# Patient Record
Sex: Female | Born: 1990 | Race: Black or African American | Hispanic: No | Marital: Single | Smoking: Never smoker
Health system: Southern US, Community
[De-identification: ages and names within clinical notes are randomized; demographics above are authoritative.]

## PROBLEM LIST (undated history)

## (undated) DIAGNOSIS — D649 Anemia, unspecified: Secondary | ICD-10-CM

---
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
## 2009-02-26 ENCOUNTER — Inpatient Hospital Stay (HOSPITAL_COMMUNITY): Admission: AD | Admit: 2009-02-26 | Discharge: 2009-02-26 | Payer: Self-pay | Admitting: Obstetrics & Gynecology

## 2009-04-20 ENCOUNTER — Encounter (INDEPENDENT_AMBULATORY_CARE_PROVIDER_SITE_OTHER): Payer: Self-pay | Admitting: *Deleted

## 2009-04-22 ENCOUNTER — Encounter: Payer: Self-pay | Admitting: Family Medicine

## 2009-04-22 ENCOUNTER — Ambulatory Visit: Payer: Self-pay | Admitting: Family Medicine

## 2009-04-22 LAB — CONVERTED CEMR LAB
Antibody Screen: NEGATIVE
Basophils Absolute: 0 10*3/uL (ref 0.0–0.1)
Eosinophils Relative: 1 % (ref 0–5)
Hemoglobin: 10.7 g/dL — ABNORMAL LOW (ref 12.0–15.0)
Hepatitis B Surface Ag: NEGATIVE
Lymphocytes Relative: 17 % (ref 12–46)
Lymphs Abs: 1.8 10*3/uL (ref 0.7–4.0)
MCV: 89.4 fL (ref 78.0–100.0)
Monocytes Absolute: 0.5 10*3/uL (ref 0.1–1.0)
Neutro Abs: 8.3 10*3/uL — ABNORMAL HIGH (ref 1.7–7.7)
Neutrophils Relative %: 78 % — ABNORMAL HIGH (ref 43–77)
Platelets: 229 10*3/uL (ref 150–400)
RDW: 13.6 % (ref 11.5–15.5)
Rh Type: POSITIVE
Rubella: 39.2 intl units/mL — ABNORMAL HIGH
Sickle Cell Screen: NEGATIVE
WBC: 10.7 10*3/uL — ABNORMAL HIGH (ref 4.0–10.5)

## 2009-04-29 ENCOUNTER — Encounter: Payer: Self-pay | Admitting: Family Medicine

## 2009-04-29 ENCOUNTER — Other Ambulatory Visit: Admission: RE | Admit: 2009-04-29 | Discharge: 2009-04-29 | Payer: Self-pay | Admitting: Family Medicine

## 2009-04-29 ENCOUNTER — Ambulatory Visit: Payer: Self-pay | Admitting: Family Medicine

## 2009-04-29 DIAGNOSIS — N898 Other specified noninflammatory disorders of vagina: Secondary | ICD-10-CM | POA: Insufficient documentation

## 2009-04-29 LAB — CONVERTED CEMR LAB: Chlamydia, DNA Probe: NEGATIVE

## 2009-04-30 ENCOUNTER — Encounter: Payer: Self-pay | Admitting: Family Medicine

## 2009-04-30 ENCOUNTER — Encounter: Payer: Self-pay | Admitting: *Deleted

## 2009-04-30 ENCOUNTER — Ambulatory Visit (HOSPITAL_COMMUNITY): Admission: RE | Admit: 2009-04-30 | Discharge: 2009-04-30 | Payer: Self-pay | Admitting: Family Medicine

## 2009-05-11 ENCOUNTER — Ambulatory Visit: Payer: Self-pay | Admitting: Family Medicine

## 2009-05-11 ENCOUNTER — Telehealth: Payer: Self-pay | Admitting: Family Medicine

## 2009-05-27 ENCOUNTER — Ambulatory Visit: Payer: Self-pay | Admitting: Family Medicine

## 2009-06-17 ENCOUNTER — Telehealth: Payer: Self-pay | Admitting: Family Medicine

## 2009-07-01 ENCOUNTER — Ambulatory Visit: Payer: Self-pay | Admitting: Family Medicine

## 2009-07-01 LAB — CONVERTED CEMR LAB
HCT: 30.7 % — ABNORMAL LOW (ref 36.0–46.0)
WBC: 12.7 10*3/uL — ABNORMAL HIGH (ref 4.0–10.5)

## 2009-07-21 ENCOUNTER — Ambulatory Visit: Payer: Self-pay | Admitting: Family Medicine

## 2009-08-02 ENCOUNTER — Telehealth: Payer: Self-pay | Admitting: Family Medicine

## 2009-08-02 ENCOUNTER — Telehealth: Payer: Self-pay | Admitting: *Deleted

## 2009-08-02 ENCOUNTER — Inpatient Hospital Stay (HOSPITAL_COMMUNITY): Admission: AD | Admit: 2009-08-02 | Discharge: 2009-08-02 | Payer: Self-pay | Admitting: Family Medicine

## 2009-08-05 ENCOUNTER — Ambulatory Visit: Payer: Self-pay | Admitting: Family Medicine

## 2009-08-20 ENCOUNTER — Ambulatory Visit: Payer: Self-pay | Admitting: Family Medicine

## 2009-08-21 ENCOUNTER — Encounter: Payer: Self-pay | Admitting: Family Medicine

## 2009-09-10 ENCOUNTER — Ambulatory Visit: Payer: Self-pay | Admitting: Family Medicine

## 2009-09-17 ENCOUNTER — Ambulatory Visit: Payer: Self-pay | Admitting: Family Medicine

## 2009-09-17 ENCOUNTER — Ambulatory Visit (HOSPITAL_COMMUNITY): Admission: RE | Admit: 2009-09-17 | Discharge: 2009-09-17 | Payer: Self-pay | Admitting: Obstetrics & Gynecology

## 2009-09-18 ENCOUNTER — Ambulatory Visit: Payer: Self-pay | Admitting: Obstetrics & Gynecology

## 2009-09-18 ENCOUNTER — Inpatient Hospital Stay (HOSPITAL_COMMUNITY): Admission: AD | Admit: 2009-09-18 | Discharge: 2009-09-21 | Payer: Self-pay | Admitting: Obstetrics & Gynecology

## 2009-09-19 ENCOUNTER — Encounter: Payer: Self-pay | Admitting: Obstetrics & Gynecology

## 2009-09-20 ENCOUNTER — Encounter: Payer: Self-pay | Admitting: Family Medicine

## 2009-11-05 ENCOUNTER — Ambulatory Visit: Payer: Self-pay | Admitting: Family Medicine

## 2010-03-06 IMAGING — US US FETAL BPP W/O NONSTRESS
1 series · 4 of 4 positions shown · non-contrast
Comparison: none

OBSTETRICAL ULTRASOUND:
 This ultrasound exam was performed in the [HOSPITAL] Ultrasound Department.  The OB US report was generated in the AS system, and faxed to the ordering physician.  This report is also available in [HOSPITAL]?s [employer] and in [REDACTED] PACS.

[Series 1: us fetal bpp w/o nonstress · non-contrast · 4 acquisitions, 4 frames shown]
[im 1/4]
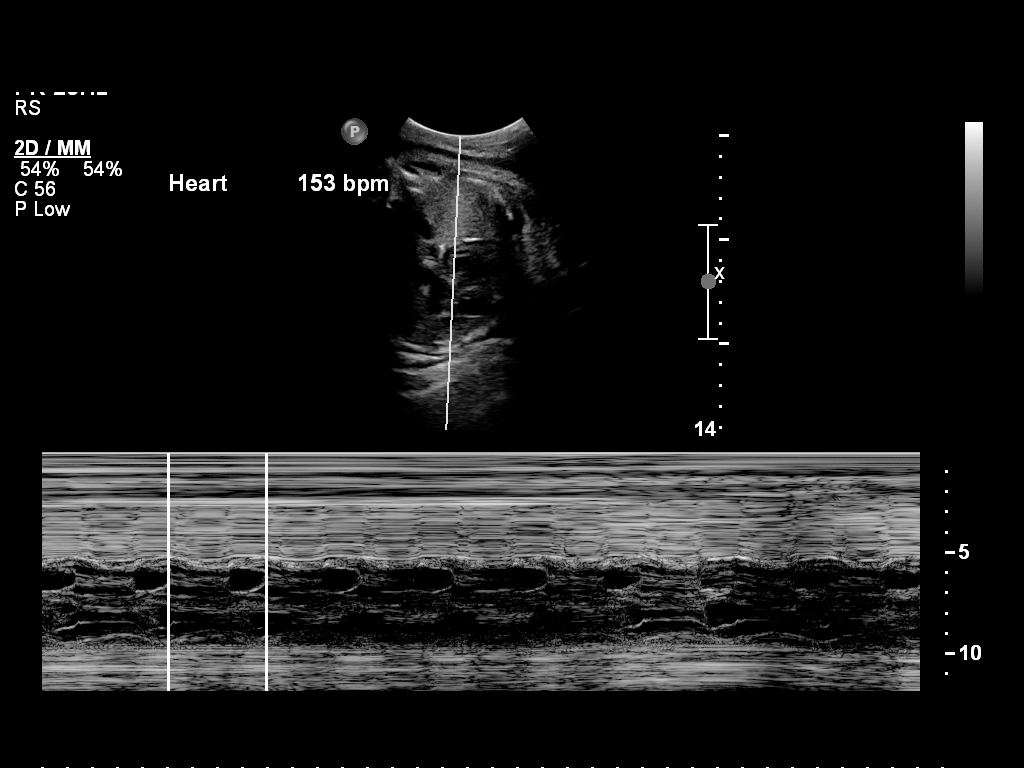
[im 2/4]
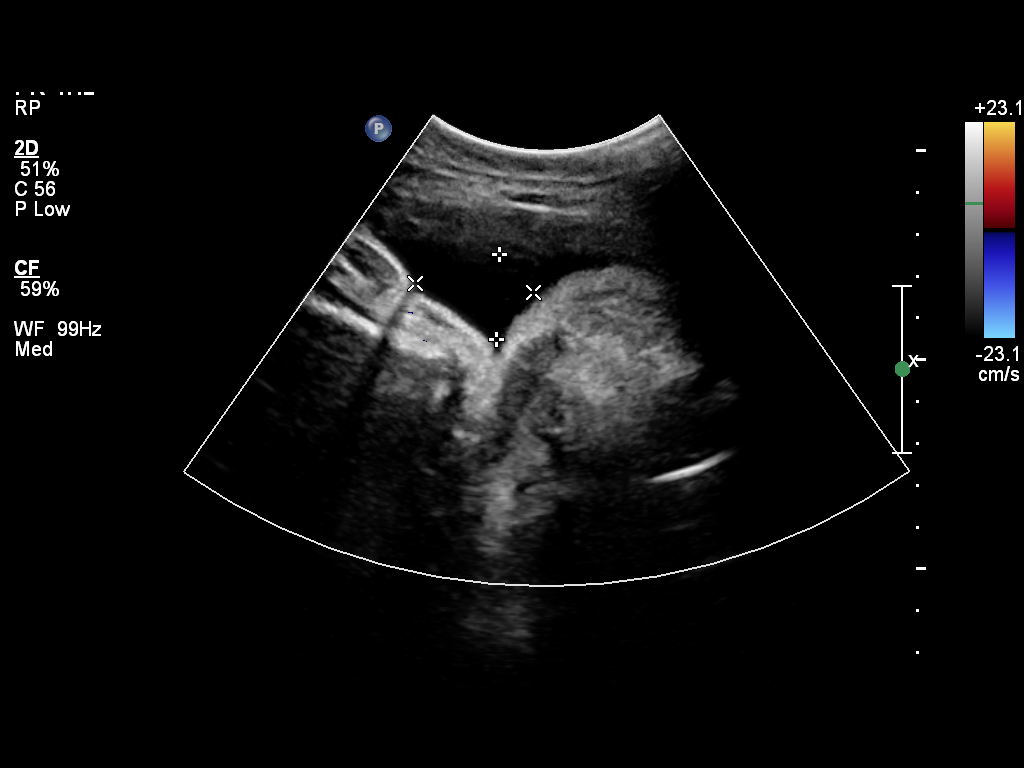
[im 3/4]
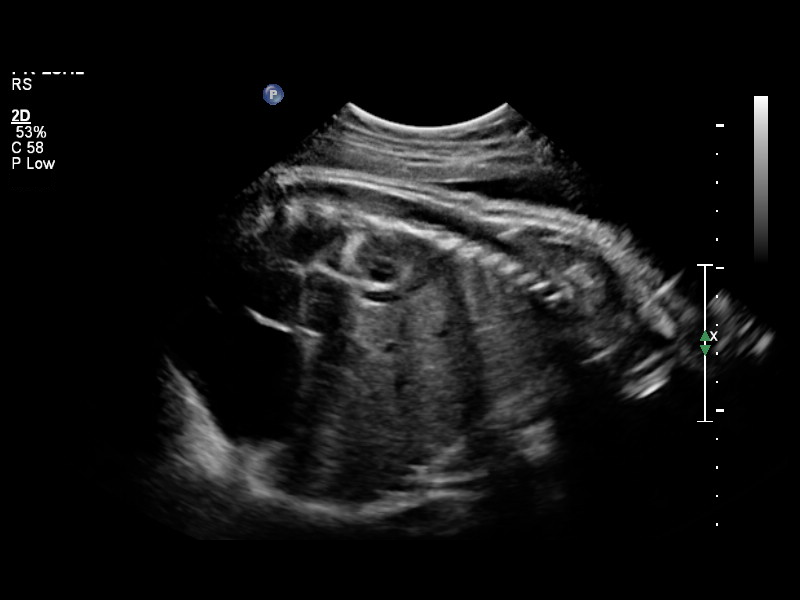
[im 4/4]
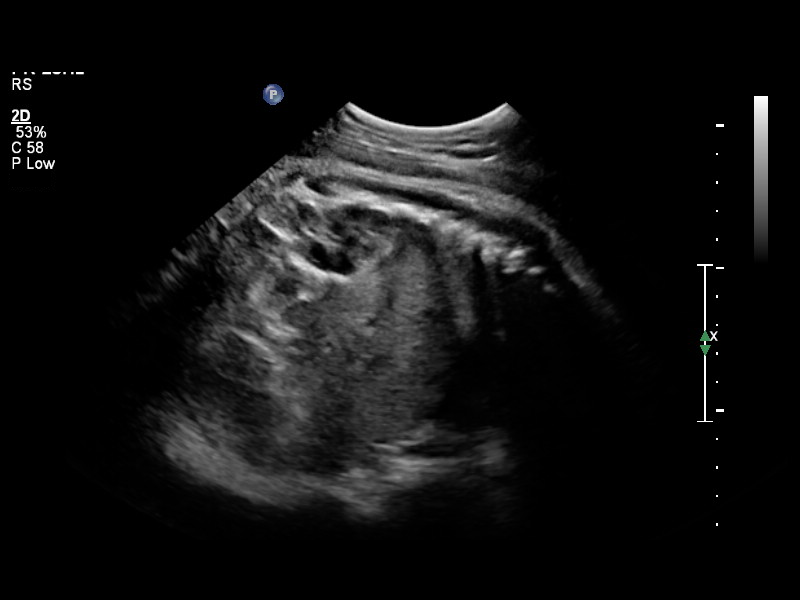

[4 of 4 positions shown; findings below may reference images not displayed]

IMPRESSION: See [facility] Obstetric US report.

## 2010-06-03 ENCOUNTER — Emergency Department (HOSPITAL_COMMUNITY): Admission: EM | Admit: 2010-06-03 | Discharge: 2010-06-03 | Payer: Self-pay | Admitting: Emergency Medicine

## 2010-06-15 ENCOUNTER — Ambulatory Visit: Payer: Self-pay | Admitting: Family Medicine

## 2010-06-16 ENCOUNTER — Encounter: Payer: Self-pay | Admitting: Family Medicine

## 2011-01-17 NOTE — Assessment & Plan Note (Signed)
Summary: sore throat,tcb   Vital Signs:  Patient profile:   20 year old female Weight:      126.4 pounds Temp:     98.1 degrees F oral Pulse rate:   86 / minute BP sitting:   94 / 61  (left arm) Cuff size:   regular  Vitals Entered By: Jimmy Footman, CMA (June 15, 2010 1:36 PM) CC: Sore throat x1 weeks Is Patient Diabetic? No Pain Assessment Patient in pain? no      Comments Seen in ER given prednisone and still not better   Primary Care Provider:  Delbert Harness MD  CC:  Sore throat x1 weeks.  History of Present Illness: Sore throat for almost 2 weeks now.  Went to ED on 6/17 and strep was negative.  Note reviewed in E-Chart.  Given prednisone and viscous lidocaine and advised to also take ibuprofen for "viral pharyngitis".  Has finished the prednisone and doesn't feel any better.  No fever, HA, nausea, vomitting, cough, runny nose, or congestion.  No heartburn.  Had "pharyngitis" 3 years ago and it feels like that.  Hurts primarily when she swallows or when she lays down at night.  Otherwise not causing much pain.    Habits & Providers  Alcohol-Tobacco-Diet     Tobacco Status: never  Physical Exam  General:  thin, alert, NAD, vitals reviewed.  Eyes:  conjunctiva clear and moist Nose:  mucosa moderately edematous, normal color, clear discharge Mouth:  oropharynx erythematous with some postnasal drip.  Tonsils minimally enlarged with some white exudate/debris Lungs:  Normal respiratory effort, chest expands symmetrically. Lungs are clear to auscultation, no crackles or wheezes. Heart:  Normal rate and regular rhythm. S1 and S2 normal without gallop, murmur, click, rub or other extra sounds. Cervical Nodes:  No lymphadenopathy noted   Impression & Recommendations:  Problem # 1:  SORE THROAT (ICD-462) Assessment New Rapid strep negative.  Will send for culture.  Irritatino could likely be caused by postnasal drip.  Will try flonase while waiting for result of throat culture.   Orders: Rapid Strep-[facility] (29518)  Complete Medication List: 1)  Calna Tabs (Prenatal vitamins) .... Take 1 tablet by mouth once daily 2)  Tri-sprintec 0.18/0.215/0.25 Mg-35 Mcg Tabs (Norgestim-eth estrad triphasic) .... Take one tablet a t the same time daily 3)  Flonase 50 Mcg/act Susp (Fluticasone propionate) .... 2 sprays each nostril daily  Other Orders: Grp A Strep-[facility] ([id number]) [facility]- Est Level  3 (60109)  Patient Instructions: 1)  Your strep test is negative again.  We will send it to be cultured though to make sure. 2)  However, with no fever or other symptoms and pain only with swallowing, it is unlikely to be a bacteria.  3)  If it does grow a bacteria we will call you and send out an antibiotic for you.  In the meantime, try the flonase nasal spray as I feel your symptoms may be coming from postnasal drip.  Prescriptions: FLONASE 50 MCG/ACT SUSP (FLUTICASONE PROPIONATE) 2 sprays each nostril daily  #1 x 3   Entered and Authorized by:   Ardeen Garland  MD   Signed by:   Ardeen Garland  MD on 06/15/2010   Method used:   Print then Give to Patient   RxID:   [id number]   Laboratory Results  Date/Time Received: June 15, 2010 2:05 PM  Date/Time Reported: June 15, 2010 2:27 PM   Other Tests  Rapid Strep: negative Comments: sent for culture ...............test performed  by......Marland KitchenBonnie A. [place], MLS (ASCP)cm

## 2011-02-02 ENCOUNTER — Encounter: Payer: Self-pay | Admitting: *Deleted

## 2011-03-23 LAB — CBC
HCT: 33.2 % — ABNORMAL LOW (ref 36.0–46.0)
Hemoglobin: 10.9 g/dL — ABNORMAL LOW (ref 12.0–15.0)
MCHC: 33.4 g/dL (ref 30.0–36.0)
MCV: 91.2 fL (ref 78.0–100.0)
Platelets: 168 10*3/uL (ref 150–400)
Platelets: 210 10*3/uL (ref 150–400)

## 2011-03-23 LAB — RPR: RPR Ser Ql: NONREACTIVE

## 2011-03-25 LAB — WET PREP, GENITAL
Clue Cells Wet Prep HPF POC: NONE SEEN
Trich, Wet Prep: NONE SEEN

## 2011-03-26 LAB — GLUCOSE, CAPILLARY: Glucose-Capillary: 109 mg/dL — ABNORMAL HIGH (ref 70–99)

## 2011-03-30 LAB — CBC
HCT: 33.4 % — ABNORMAL LOW (ref 36.0–46.0)
Hemoglobin: 11.2 g/dL — ABNORMAL LOW (ref 12.0–15.0)
MCHC: 33.6 g/dL (ref 30.0–36.0)
MCV: 88.8 fL (ref 78.0–100.0)
Platelets: 256 10*3/uL (ref 150–400)
RBC: 3.76 MIL/uL — ABNORMAL LOW (ref 3.87–5.11)
RDW: 13.1 % (ref 11.5–15.5)
WBC: 10.8 10*3/uL — ABNORMAL HIGH (ref 4.0–10.5)

## 2011-03-30 LAB — WET PREP, GENITAL
Trich, Wet Prep: NONE SEEN
Yeast Wet Prep HPF POC: NONE SEEN

## 2011-03-30 LAB — ABO/RH: ABO/RH(D): O POS

## 2011-03-30 LAB — HCG, QUANTITATIVE, PREGNANCY: hCG, Beta Chain, Quant, S: 136766 m[IU]/mL — ABNORMAL HIGH (ref <5)

## 2011-03-30 LAB — GC/CHLAMYDIA PROBE AMP, GENITAL: GC Probe Amp, Genital: NEGATIVE

## 2011-09-12 LAB — RAPID STREP SCREEN (MED CTR MEBANE ONLY): Streptococcus, Group A Screen (Direct): NEGATIVE

## 2014-01-23 ENCOUNTER — Emergency Department (HOSPITAL_COMMUNITY)
Admission: EM | Admit: 2014-01-23 | Discharge: 2014-01-23 | Disposition: A | Discharge status: Home / Self Care | Payer: No Typology Code available for payment source | Attending: Emergency Medicine | Admitting: Emergency Medicine

## 2014-01-23 ENCOUNTER — Encounter (HOSPITAL_COMMUNITY): Payer: Self-pay | Admitting: Emergency Medicine

## 2014-01-23 DIAGNOSIS — Z3202 Encounter for pregnancy test, result negative: Secondary | ICD-10-CM | POA: Insufficient documentation

## 2014-01-23 DIAGNOSIS — Z79899 Other long term (current) drug therapy: Secondary | ICD-10-CM | POA: Insufficient documentation

## 2014-01-23 DIAGNOSIS — N898 Other specified noninflammatory disorders of vagina: Secondary | ICD-10-CM | POA: Insufficient documentation

## 2014-01-23 DIAGNOSIS — Z8619 Personal history of other infectious and parasitic diseases: Secondary | ICD-10-CM | POA: Insufficient documentation

## 2014-01-23 LAB — WET PREP, GENITAL
Trich, Wet Prep: NONE SEEN
Yeast Wet Prep HPF POC: NONE SEEN

## 2014-01-23 LAB — URINALYSIS, ROUTINE W REFLEX MICROSCOPIC
BILIRUBIN URINE: NEGATIVE
Glucose, UA: NEGATIVE mg/dL
Ketones, ur: NEGATIVE mg/dL
Nitrite: NEGATIVE
Protein, ur: NEGATIVE mg/dL
Specific Gravity, Urine: <1.005 — ABNORMAL LOW (ref 1.005–1.030)
Urobilinogen, UA: 0.2 mg/dL (ref 0.0–1.0)
pH: 6.5 (ref 5.0–8.0)

## 2014-01-23 LAB — URINE MICROSCOPIC-ADD ON

## 2014-01-23 LAB — POCT PREGNANCY, URINE: Preg Test, Ur: NEGATIVE

## 2014-01-23 MED ORDER — AZITHROMYCIN 250 MG PO TABS
1000.0000 mg | ORAL_TABLET | Freq: Once | ORAL | Status: AC
  Administered 2014-01-23: 1000 mg via ORAL
  Filled 2014-01-23: qty 4

## 2014-01-23 MED ORDER — LIDOCAINE HCL (PF) 1 % IJ SOLN
INTRAMUSCULAR | Status: AC
  Administered 2014-01-23: 0.9 mL
  Filled 2014-01-23: qty 5

## 2014-01-23 MED ORDER — CEFTRIAXONE SODIUM 250 MG IJ SOLR
250.0000 mg | Freq: Once | INTRAMUSCULAR | Status: AC
  Administered 2014-01-23: 250 mg via INTRAMUSCULAR
  Filled 2014-01-23: qty 250

## 2014-01-23 NOTE — ED Notes (Signed)
Pt in c/o vaginal discharge and itching x1 week

## 2014-01-23 NOTE — ED Provider Notes (Signed)
Medical screening examination/treatment/procedure(s) were conducted as a shared visit with non-physician practitioner(s) or resident and myself. I personally evaluated the patient during the encounter and agree with the findings and plan unless otherwise indicated.  I have personally reviewed any xrays and/ or EKG's with the provider and I agree with interpretation.  Vaginal discharge for 1 wk. No abd pain. STD hx. No new sexual partners. GU exam benign per NP. STD prophylaxis, fup outpt discussed. Abdo soft/ NT, well appearing.   Vaginal discharge   Enid SkeensJoshua M Caryl Fate, MD 01/23/14 2007

## 2014-01-23 NOTE — ED Notes (Signed)
Vaginal itching with thick whitish discharge, stated vagina was swelling and she applied warm pack. Stated gonorrhea few months ago and was treated. Stated some pain with walking because of the swelling.

## 2014-01-23 NOTE — ED Provider Notes (Signed)
CSN: [id number]     Arrival date & time 01/23/14  1456 History   First MD Initiated Contact with Patient 01/23/14 [phone]     Chief Complaint  Patient presents with  . Vaginal Discharge   (Consider location/radiation/quality/duration/timing/severity/associated sxs/prior Treatment) HPI  23 year old female with prior history of gonorrhea and history of BV presents complaining of vaginal discomfort. Patient states for the past week she has noticed some mild swelling to the vaginal lips, with itchiness and for the past 2-3 days she also notice white vaginal discharge. States she has been applying warm compresses and taking Tylenol with minimal relief. Her symptoms is progressively worse prompting her to come to the ER for further evaluation. No complaints of fever, chills, abdominal pain, back pain, dysuria, hematuria, pain with sexual activity, or rash. Last sexual activity was 3 weeks ago. Last menstrual period was 2 weeks ago. Currently not pregnant.  History reviewed. No pertinent past medical history. No past surgical history on file. No family history on file. History  Substance Use Topics  . Smoking status: Not on file  . Smokeless tobacco: Not on file  . Alcohol Use: Not on file   OB History   Grav Para Term Preterm Abortions TAB SAB Ect Mult Living                 Review of Systems  Constitutional: Negative for fever.  Gastrointestinal: Negative for abdominal pain.  Genitourinary: Positive for vaginal discharge. Negative for dysuria and pelvic pain.  Skin: Negative for rash.  Neurological: Negative for numbness.    Allergies  Review of patient's allergies indicates no known allergies.  Home Medications   Current Outpatient Rx  Name  Route  Sig  Dispense  Refill  . acetaminophen (TYLENOL) 500 MG tablet   Oral   Take 500 mg by mouth every 6 (six) hours as needed for mild pain.         . Probiotic Product (PROBIOTIC & ACIDOPHILUS EX ST PO)   Oral   Take 1 capsule by  mouth daily.          BP 144/87  Pulse 83  Temp(Src) 98.5 F (36.9 C) (Oral)  Resp 20  SpO2 100%  LMP 01/04/2014 Physical Exam  Nursing note and vitals reviewed. Constitutional: She appears well-developed and well-nourished. No distress.  HENT:  Head: Normocephalic and atraumatic.  Eyes: Conjunctivae are normal.  Neck: Normal range of motion. Neck supple.  Cardiovascular: Normal rate and regular rhythm.   Pulmonary/Chest: Effort normal and breath sounds normal. She exhibits no tenderness.  Abdominal: Soft. There is no tenderness.  Genitourinary: Vagina normal and uterus normal. There is no rash or lesion on the right labia. There is no rash or lesion on the left labia. Cervix exhibits no motion tenderness and no discharge. Right adnexum displays no mass and no tenderness. Left adnexum displays no mass and no tenderness. No erythema, tenderness or bleeding around the vagina. No vaginal discharge found.  Chaperone present:  Lymphadenopathy:       Right: No inguinal adenopathy present.       Left: No inguinal adenopathy present.    ED Course  Procedures (including critical care time)  8:05 PM Vaginal discharge.  Wet prep unremarkable but given vaginal discharge, prior STD, pt received prophylactic treatment with rocephin/zithromax.  Cultures sent.    Labs Review Labs Reviewed  WET PREP, GENITAL - Abnormal; Notable for the following:    Clue Cells Wet Prep HPF POC FEW (*)  WBC, Wet Prep HPF POC MANY (*)    All other components within normal limits  URINALYSIS, ROUTINE W REFLEX MICROSCOPIC - Abnormal; Notable for the following:    APPearance HAZY (*)    Specific Gravity, Urine <1.005 (*)    Hgb urine dipstick TRACE (*)    Leukocytes, UA LARGE (*)    All other components within normal limits  URINE MICROSCOPIC-ADD ON - Abnormal; Notable for the following:    Squamous Epithelial / LPF MANY (*)    Bacteria, UA FEW (*)    All other components within normal limits   GC/CHLAMYDIA PROBE AMP  URINE CULTURE  POCT PREGNANCY, URINE   Imaging Review No results found.  EKG Interpretation   None       MDM   1. Vaginal discharge    BP 123/78  Pulse 83  Temp(Src) 98.1 F (36.7 C) (Oral)  Resp 18  SpO2 100%  LMP 01/04/2014     Fayrene Helper, PA-C 01/23/14 2006

## 2014-01-23 NOTE — Discharge Instructions (Signed)
Follow up vaginal cultures. If you were given medicines take as directed.  If you are on coumadin or contraceptives realize their levels and effectiveness is altered by many different medicines.  If you have any reaction (rash, tongues swelling, other) to the medicines stop taking and see a physician.   Please follow up as directed and return to the ER or see a physician for new or worsening symptoms.  Thank you.

## 2014-01-24 LAB — GC/CHLAMYDIA PROBE AMP
CT Probe RNA: NEGATIVE
GC PROBE AMP APTIMA: NEGATIVE

## 2014-01-24 LAB — URINE CULTURE
COLONY COUNT: NO GROWTH
CULTURE: NO GROWTH

## 2014-03-10 ENCOUNTER — Encounter (HOSPITAL_COMMUNITY): Payer: Self-pay | Admitting: Emergency Medicine

## 2014-03-10 ENCOUNTER — Emergency Department (HOSPITAL_COMMUNITY)
Admission: EM | Admit: 2014-03-10 | Discharge: 2014-03-10 | Disposition: A | Discharge status: Home / Self Care | Payer: No Typology Code available for payment source | Attending: Emergency Medicine | Admitting: Emergency Medicine

## 2014-03-10 DIAGNOSIS — J020 Streptococcal pharyngitis: Secondary | ICD-10-CM | POA: Insufficient documentation

## 2014-03-10 DIAGNOSIS — Z79899 Other long term (current) drug therapy: Secondary | ICD-10-CM | POA: Insufficient documentation

## 2014-03-10 LAB — RAPID STREP SCREEN (MED CTR MEBANE ONLY): Streptococcus, Group A Screen (Direct): POSITIVE — AB

## 2014-03-10 MED ORDER — PENICILLIN G BENZATHINE 1200000 UNIT/2ML IM SUSP
1.2000 10*6.[IU] | Freq: Once | INTRAMUSCULAR | Status: AC
  Administered 2014-03-10: 1.2 10*6.[IU] via INTRAMUSCULAR
  Filled 2014-03-10: qty 2

## 2014-03-10 MED ORDER — DEXAMETHASONE SODIUM PHOSPHATE 10 MG/ML IJ SOLN
10.0000 mg | Freq: Once | INTRAMUSCULAR | Status: AC
  Administered 2014-03-10: 10 mg via INTRAMUSCULAR
  Filled 2014-03-10: qty 1

## 2014-03-10 NOTE — Discharge Instructions (Signed)
Drink plenty of fluids and rest  Return to the emergency department if you develop any changing/worsening condition, fever not reducing, difficulty swallowing/breathing, neck swelling, or any other concerns (please read additional information regarding your condition below)    Strep Throat Strep throat is an infection of the throat caused by a bacteria named Streptococcus pyogenes. Your caregiver may call the infection streptococcal "tonsillitis" or "pharyngitis" depending on whether there are signs of inflammation in the tonsils or back of the throat. Strep throat is most common in children aged 5 15 years during the cold months of the year, but it can occur in people of any age during any season. This infection is spread from person to person (contagious) through coughing, sneezing, or other close contact. SYMPTOMS   Fever or chills.  Painful, swollen, red tonsils or throat.  Pain or difficulty when swallowing.  White or yellow spots on the tonsils or throat.  Swollen, tender lymph nodes or "glands" of the neck or under the jaw.  Red rash all over the body (rare). DIAGNOSIS  Many different infections can cause the same symptoms. A test must be done to confirm the diagnosis so the right treatment can be given. A "rapid strep test" can help your caregiver make the diagnosis in a few minutes. If this test is not available, a light swab of the infected area can be used for a throat culture test. If a throat culture test is done, results are usually available in a day or two. TREATMENT  Strep throat is treated with antibiotic medicine. HOME CARE INSTRUCTIONS   Gargle with 1 tsp of salt in 1 cup of warm water, 3 4 times per day or as needed for comfort.  Family members who also have a sore throat or fever should be tested for strep throat and treated with antibiotics if they have the strep infection.  Make sure everyone in your household washes their hands well.  Do not share food,  drinking cups, or personal items that could cause the infection to spread to others.  You may need to eat a soft food diet until your sore throat gets better.  Drink enough water and fluids to keep your urine clear or pale yellow. This will help prevent dehydration.  Get plenty of rest.  Stay home from school, daycare, or work until you have been on antibiotics for 24 hours.  Only take over-the-counter or prescription medicines for pain, discomfort, or fever as directed by your caregiver.  If antibiotics are prescribed, take them as directed. Finish them even if you start to feel better. SEEK MEDICAL CARE IF:   The glands in your neck continue to enlarge.  You develop a rash, cough, or earache.  You cough up green, yellow-brown, or bloody sputum.  You have pain or discomfort not controlled by medicines.  Your problems seem to be getting worse rather than better. SEEK IMMEDIATE MEDICAL CARE IF:   You develop any new symptoms such as vomiting, severe headache, stiff or painful neck, chest pain, shortness of breath, or trouble swallowing.  You develop severe throat pain, drooling, or changes in your voice.  You develop swelling of the neck, or the skin on the neck becomes red and tender.  You have a fever.  You develop signs of dehydration, such as fatigue, dry mouth, and decreased urination.  You become increasingly sleepy, or you cannot wake up completely. Document Released: 12/01/2000 Document Revised: 11/20/2012 Document Reviewed: 02/02/2011 [facility] Patient Information 2014 [employer], [place].  Emergency Department Resource Guide 1) Find a Doctor and Pay Out of Pocket Although you won't have to find out who is covered by your insurance plan, it is a good idea to ask around and get recommendations. You will then need to call the office and see if the doctor you have chosen will accept you as a new patient and what types of options they offer for patients who are self-pay.  Some doctors offer discounts or will set up payment plans for their patients who do not have insurance, but you will need to ask so you aren't surprised when you get to your appointment.  2) Contact Your Local Health Department Not all health departments have doctors that can see patients for sick visits, but many do, so it is worth a call to see if yours does. If you don't know where your local health department is, you can check in your phone book. The CDC also has a tool to help you locate your state's health department, and many state websites also have listings of all of their local health departments.  3) Find a Walk-in Clinic If your illness is not likely to be very severe or complicated, you may want to try a walk in clinic. These are popping up all over the country in pharmacies, drugstores, and shopping centers. They're usually staffed by nurse practitioners or physician assistants that have been trained to treat common illnesses and complaints. They're usually fairly quick and inexpensive. However, if you have serious medical issues or chronic medical problems, these are probably not your best option.  No Primary Care Doctor: - Call Health Connect at  [phone] - they can help you locate a primary care doctor that  accepts your insurance, provides certain services, etc. - Physician Referral Service- [phone]  Chronic Pain Problems: Organization         Address  Phone   Notes  [facility]  [phone] Patients need to be referred by their primary care doctor.   Medication Assistance: Organization         Address  Phone   Notes  [facility] Medication [facility] [address] [place], [place] [address] [phone] --Must be a resident of [facility] -- Must have NO insurance coverage whatsoever (no Medicaid/ Medicare, etc.) -- The pt. MUST have a primary care doctor that directs their care regularly and follows them in the  community   [facility]ist  [phone]   [employer]  [phone]    Agencies that provide inexpensive medical care: Organization         Address  Phone   Notes  [facility]  [phone]   [facility]    [phone]   [facility] [address] [place], [place] [address] [phone]   [facility] [address] [place], [place] [phone]   Planned Parenthood    [phone]   [facility]    [phone]   [facility]  [address], [place] Phone:  [phone], Fax:  [phone] Hours of Operation:  9 am - 6 pm, M-F.  Also accepts Medicaid/Medicare and self-pay.  [facility]  [address], [place] Phone: [phone], Fax: [phone]. Hours of Operation:  8:30 am - 5:30 pm, M-F.  Also accepts Medicaid and self-pay.  [facility] [place]  [address] [employer] Phone: [phone]   [facility] [address] Natasha Bence [place], [place] [phone], Ext. [phone] Mondays & Thursdays: 7-9 AM.  First 15 patients are seen on a first come, first serve basis.    Medicaid-accepting [facility] Providers:  Organization         Address  Phone   Notes  [facility] [address], [place] [phone] Also accepts self-pay patients.  [facility] [address] Laurell Josephs [place], [place]  [phone]   [facility] [address], [place] [phone]   [facility] [address] [place] [phone]   Renaye Rakers [address], [place]   [phone] Only accepts [place] patients after they have their name applied to their card.   Self-Pay (no insurance) in [facility]:  Organization         Address  Phone   Notes  Sickle Cell Patients, [facility]  Internal Medicine [address] [place], [place] [phone]   [facility] [address] [place], [place] [phone]   [facility] [place]  [address], [place] [phone]   [facility]/Dr. Osei-Bonsu  [address] [place] or [address], [place] [phone] Phone number for both [place] and [place] locations is the same.  [facility] [address] [place] [phone]   [facility] [address] [place] or [address] [phone]   [facility] [address] [place] [phone], phone; [phone], fax Sees patients 1st and 3rd Saturday of every month.  Must not qualify for public or private insurance (i.e. Medicaid, Medicare, [place] Health Choice, Veterans' Benefits)  Household income should be no more than 200% of the poverty level The clinic cannot treat you if you are pregnant or think you are pregnant  Sexually transmitted diseases are not treated at the clinic.    Dental Care: Organization         Address  Phone  Notes  [facility] Department of [facility] [address] [place], [place] [phone] Accepts children up to age 35 who are enrolled in [place] or [place] Health Choice; pregnant women with a Medicaid card; and children who have applied for Medicaid or [place] Health Choice, but were declined, whose parents can pay a reduced fee at time of service.  [facility] Department of [facility]  [address], [place] [phone] Accepts children up to age 6 who are enrolled in [place] or [place] Health Choice; pregnant women with a Medicaid card; and children who have applied for Medicaid or [place] Health Choice, but were declined, whose parents can pay a reduced fee at time of service.  [facility]  [address] [place], [place] [phone] Patients are seen by appointment only. Walk-ins are not accepted. [facility] will see patients 66 years of age and older. Monday - Tuesday (8am-5pm) Most Wednesdays (8:30-5pm) $30 per visit, cash only  [facility]  [address], [facility] [phone] Patients are seen by appointment only. Walk-ins are not accepted. [facility] will see patients 47 years of age and older.  One Wednesday Evening (Monthly: Volunteer Based).  $30 per visit, cash only  [employer] [facility]  [phone] for adults; Children under age 804, call Graduate Pediatric Dentistry at [phone]. Children aged 394-14, please call [phone] to request a pediatric application.  Dental services are provided in all areas of dental care including fillings, crowns and bridges, complete and partial dentures, implants, gum treatment, root canals, and extractions. Preventive care is also provided. Treatment is provided to both adults and children. Patients are selected via a lottery and there is often a waiting list.   [facility] [address], [place]  [phone] [email or web address]   [facility] [address], [place], [place] [phone], Ext. [phone] Second and Fourth Thursday of each month, opens at 6:30 AM; Clinic ends at 9 AM.  Patients are seen on a first-come first-served basis, and a limited number are seen during each clinic.   [facility]  [address], [place], [place] [phone]) [phone]   Eligibility Requirements You must have lived in [place], [place], or [place] counties for at least the last three months.   You cannot be eligible for state or federal sponsored [place] Cityhealthcare insurance, including CIGNAVeterans Administration, [place]icaid, or [employer]Medicare.   You generally cannot be eligible for healthcare insurance through your employer.    How to apply: Eligibility screenings are held every  Tuesday and Wednesday afternoon from 1:00 pm until 4:00 pm. You do not need an appointment for the interview!  [facility] [address], [place], [place] [phone]   [facility]  [phone]   [facility]  [phone]   [facility]  [phone]    Behavioral Health Resources in the Community: Intensive Outpatient Programs Organization         Address  Phone  Notes  [facility] [address], [place], [place] [phone]   [facility] [address], [place], [place] [phone]   [facility] [address], [place], [place]  [phone]   [facility] [address],  [place], [place] [phone] or [phone]   Substance Abuse Resources Organization         Address  Phone  Notes  [facility]  [phone]   [facility]  [phone]   [facility]  [phone]   [facility]  [phone]   Residential & Outpatient Substance Abuse Program  [phone]   Psychological Services Organization         Address  Phone  Notes  [facility]  [phone]- [phone]   [facility]  [phone]- [phone]   [facility] [address], [place] [phone] or [phone]    Mobile Crisis Teams Organization         Address  Phone  Notes  [facility], Mobile Crisis Care Unit  [phone]   [facility]  [address] [place], [place] [phone]   Doristine LocksSharon DeEsch [address] [place] [phone]    Self-Help/Support Groups Organization         Address  Phone             Notes  [facility] [place] - variety of support groups  [phone] Call for more information  Narcotics Anonymous (NA), [facility] [address], [place]  2 meetings at this location  Residential Treatment Programs Organization         Address  Phone  Notes  [facility] [address],    [place]  [phone]   [facility]  [address], [place] [phone], [place], [place] [phone]   [facility] [address], [place] [phone] Admissions: 8am-3pm M-F  [facility] [address]    [place], [place] [phone]   [facility] [address], [place], [place] [phone]   [facility] [address]  [place], [place] [phone]   [facility] - Intensive Outpatient [address], [place], [place] [phone]   [facility] ([facility] [address]  [place], [place] [phone] or [phone]   [facility] ([facility]) [address] [place], [place] [phone] Accepts Medicaid  [facility] [address]  [place] [phone] Substance Abuse/Addiction Treatment   [facility] Resources Organization         Address  Phone  Notes  [facility]  [phone]   Angie FavaJulie Brannon, PhD [address], [address] [place], [place]   [phone] or [phone]   [facility]   [address] [place], [place] [phone]   [facility] [address], [place], [place] [phone] Insurance/Medicaid/sponsorship through [facility]  [facility] Families [address]                                    [place], [place] [phone] Therapy/tele-psych/case  [facility] [address].   [place], [place] [phone](336) [phone]    Dr. Lolly MustacheArfeen  [phone]   [facility]  [employer] [facility] 1) [address], [place] 2) [address], [place] 3)  [address], [place] [phone]) [phone]  [phone]   [facility] Child Abuse Hotline [phone]) [phone] or [phone]) [phone] (After  Hours)

## 2014-03-10 NOTE — ED Provider Notes (Signed)
CSN: [id number]     Arrival date & time 03/10/14  1449 History   None   This chart was scribed for Junius Argyle PA-C, a non-physician practitioner working with Enid Skeens, MD by Lewanda Rife, ED Scribe. This patient was seen in room TR06C/TR06C and the patient's care was started at 3:38 PM      Chief Complaint  Patient presents with  . Sore Throat    The history is provided by the patient. No language interpreter was used.   HPI Comments: Karen Randall is a 23 y.o. female with no PMH who presents to the Emergency Department complaining of constant moderate sore throat onset yesterday. Reports associated headaches, subjective fever, myalgias and chills. Reports pain is exacerbated by swallowing. Denies trying any alleviating factors. Denies associated dysphagia, and cough. Denies sick contacts.    History reviewed. No pertinent past medical history. History reviewed. No pertinent past surgical history. History reviewed. No pertinent family history. History  Substance Use Topics  . Smoking status: Not on file  . Smokeless tobacco: Not on file  . Alcohol Use: Yes     Comment: socially   OB History   Grav Para Term Preterm Abortions TAB SAB Ect Mult Living                 Review of Systems  Constitutional: Positive for fever (subjective) and chills. Negative for diaphoresis, activity change and appetite change.  HENT: Positive for sore throat. Negative for congestion, rhinorrhea, trouble swallowing and voice change.   Respiratory: Negative for cough and shortness of breath.   Cardiovascular: Negative for chest pain.  Gastrointestinal: Negative for nausea, vomiting, abdominal pain and diarrhea.  Musculoskeletal: Positive for myalgias. Negative for neck pain and neck stiffness.  Skin: Negative for rash.  Neurological: Positive for headaches. Negative for dizziness, weakness and numbness.  Psychiatric/Behavioral: Negative for confusion.  All other systems reviewed  and are negative.    A complete 10 system review of systems was obtained and all systems are negative except as noted in the HPI and PMHx.     Allergies  Review of patient's allergies indicates no known allergies.  Home Medications   Current Outpatient Rx  Name  Route  Sig  Dispense  Refill  . acetaminophen (TYLENOL) 500 MG tablet   Oral   Take 500 mg by mouth every 6 (six) hours as needed for mild pain.         . Probiotic Product (PROBIOTIC & ACIDOPHILUS EX ST PO)   Oral   Take 1 capsule by mouth daily.          BP 121/67  Pulse 86  Temp(Src) 99 F (37.2 C) (Oral)  Resp 18  SpO2 97%  LMP 03/04/2014  Filed Vitals:   03/10/14 1456 03/10/14 1621  BP: 121/67 102/60  Pulse: 86 90  Temp: 99 F (37.2 C) 99 F (37.2 C)  TempSrc: Oral Oral  Resp: 18 17  SpO2: 97% 100%    Physical Exam  Nursing note and vitals reviewed. Constitutional: She is oriented to person, place, and time. She appears well-developed and well-nourished. No distress.  HENT:  Head: Normocephalic and atraumatic.  Right Ear: External ear normal.  Left Ear: External ear normal.  Nose: Nose normal.  Mouth/Throat: Oropharyngeal exudate present.  Tonsils with edema, erythema and exudates. Tonsils 2+ equal bilaterally. Uvula midline. No trismus. No difficulty controlling secretions. Tympanic membranes gray and translucent bilaterally with no erythema, edema, or hemotympanum.  No mastoid  or tragal tenderness bilaterally.   Eyes: Conjunctivae and EOM are normal. Pupils are equal, round, and reactive to light. Right eye exhibits no discharge. Left eye exhibits no discharge.  Neck: Normal range of motion. Neck supple. No tracheal deviation present.  Cardiovascular: Normal rate, regular rhythm and normal heart sounds.  Exam reveals no gallop and no friction rub.   No murmur heard. Pulmonary/Chest: Effort normal and breath sounds normal. No respiratory distress. She has no wheezes. She has no rales. She  exhibits no tenderness.  Abdominal: Soft. She exhibits no distension. There is no tenderness.  Musculoskeletal: Normal range of motion. She exhibits no edema and no tenderness.  Lymphadenopathy:    She has no cervical adenopathy.  Neurological: She is alert and oriented to person, place, and time.  Skin: Skin is warm and dry. No rash noted. She is not diaphoretic.  Psychiatric: She has a normal mood and affect. Her behavior is normal.    ED Course  Procedures  COORDINATION OF CARE:  Nursing notes reviewed. Vital signs reviewed. Initial pt interview and examination performed.   3:00 PM-Discussed work up plan with pt at bedside, which includes rapid strep screen. Pt agrees with plan.  3:38 PM Nursing Notes Reviewed/ Care Coordinated Interpretation of Laboratory Data incorporated into ED treatment Discussed results and treatment plan with pt. Pt demonstrates understanding and agrees with plan.   Treatment plan initiated:Medications - No data to display   Initial diagnostic testing ordered.     Labs Review Labs Reviewed  RAPID STREP SCREEN - Abnormal; Notable for the following:    Streptococcus, Group A Screen (Direct) POSITIVE (*)    All other components within normal limits   Imaging Review No results found.   EKG Interpretation None      MDM   Karen Randall is a 23 y.o. female with no PMH who presents to the Emergency Department complaining of constant moderate sore throat onset yesterday. Etiology of sore throat likely due to strep pharyngitis. Rapid strep positive. No evidence of peritonsillar or retropharyngeal abscess. No difficulty swallowing or controlling secretions. Patient afebrile and non-toxic in appearance. Patient given Penicillin IM and Decadron in the ED. Instructed to follow-up with PCP if symptoms not improving or worsening. Return precautions, discharge instructions, and follow-up was discussed with the patient before discharge.     Discharge  Medication List as of 03/10/2014  3:46 PM       Final impressions: 1. Strep pharyngitis       Karen SalesJessica Katlin Ennifer Harston PA-C   I personally performed the services described in this documentation, which was scribed in my presence. The recorded information has been reviewed and is accurate.       Karen LedgerJessica K Tavone Caesar, PA-C 03/11/14 1151

## 2014-03-10 NOTE — ED Notes (Signed)
Pt reports sore throat since yesterday morning and reports headache and chills last night. Airway intact.

## 2014-03-12 NOTE — ED Provider Notes (Signed)
Medical screening examination/treatment/procedure(s) were conducted as a shared visit with non-physician practitioner(s) or resident and myself. I personally evaluated the patient during the encounter and agree with the findings and plan unless otherwise indicated.  I have personally reviewed any xrays and/ or EKG's with the provider and I agree with interpretation.  Healthy female with sore throat since yesterday morning worse with swallowing mild gradual onset headaches and intermittent chills. No sick contacts or recent travel. Exam well-appearing female pharynx moist mucous membranes mild posterior erythema, no signs of PTA, supple neck, mild anterior cervical lymphadenopathy. No submandibular swelling or voice change. No signs of life-threatening head and neck infection. Plan for strep test and close followup outpatient.  Pharyngitis   Enid SkeensJoshua M Zaniah Titterington, MD 03/12/14 [phone]

## 2016-01-05 ENCOUNTER — Encounter (HOSPITAL_COMMUNITY): Payer: Self-pay | Admitting: *Deleted

## 2016-01-05 ENCOUNTER — Emergency Department (HOSPITAL_COMMUNITY)
Admission: EM | Admit: 2016-01-05 | Discharge: 2016-01-05 | Disposition: A | Discharge status: Home / Self Care | Payer: No Typology Code available for payment source | Attending: Emergency Medicine | Admitting: Emergency Medicine

## 2016-01-05 DIAGNOSIS — Z79899 Other long term (current) drug therapy: Secondary | ICD-10-CM | POA: Insufficient documentation

## 2016-01-05 DIAGNOSIS — J039 Acute tonsillitis, unspecified: Secondary | ICD-10-CM | POA: Insufficient documentation

## 2016-01-05 DIAGNOSIS — Z91040 Latex allergy status: Secondary | ICD-10-CM | POA: Insufficient documentation

## 2016-01-05 DIAGNOSIS — J029 Acute pharyngitis, unspecified: Secondary | ICD-10-CM

## 2016-01-05 LAB — RAPID STREP SCREEN (MED CTR MEBANE ONLY): STREPTOCOCCUS, GROUP A SCREEN (DIRECT): NEGATIVE

## 2016-01-05 NOTE — Discharge Instructions (Signed)
Continue to stay well-hydrated. Gargle warm salt water and spit it out. Use over the counter throat lozenges or chloraseptic spray as needed for additional pain relief. Continue to alternate between Tylenol and Ibuprofen for pain or fever. Use Mucinex for cough suppression/expectoration of mucus. Use netipot and flonase to help with nasal congestion. May consider over-the-counter Benadryl or other antihistamine to decrease secretions and for watery itchy eyes. Your strep test today was negative, but it will be sent for culture. The lab will call you if your strep test culture is positive, and will call you in an antibiotic at that time. It's possible that your sore throat is from a viral infection. Followup with [facility] in 5-7 days for recheck of ongoing symptoms and to establish medical care. Return to emergency department for emergent changing or worsening of symptoms.   Pharyngitis Pharyngitis is a sore throat (pharynx). There is redness, pain, and swelling of your throat. HOME CARE   Drink enough fluids to keep your pee (urine) clear or pale yellow.  Only take medicine as told by your doctor.  You may get sick again if you do not take medicine as told. Finish your medicines, even if you start to feel better.  Do not take aspirin.  Rest.  Rinse your mouth (gargle) with salt water ( tsp of salt per 1 qt of water) every 1-2 hours. This will help the pain.  If you are not at risk for choking, you can suck on hard candy or sore throat lozenges. GET HELP IF:  You have large, tender lumps on your neck.  You have a rash.  You cough up green, yellow-brown, or bloody spit. GET HELP RIGHT AWAY IF:   You have a stiff neck.  You drool or cannot swallow liquids.  You throw up (vomit) or are not able to keep medicine or liquids down.  You have very bad pain that does not go away with medicine.  You have problems breathing (not from a stuffy nose). MAKE SURE YOU:    Understand these instructions.  Will watch your condition.  Will get help right away if you are not doing well or get worse.   This information is not intended to replace advice given to you by your health care provider. Make sure you discuss any questions you have with your health care provider.   Document Released: 05/22/2008 Document Revised: 09/24/2013 Document Reviewed: 08/11/2013 Elsevier Interactive Patient Education 2016 [employer]  Tonsillitis Tonsillitis is an infection of the throat. This infection causes the tonsils to become red, tender, and puffy (swollen). Tonsils are groups of tissue at the back of your throat. If bacteria caused your infection, antibiotic medicine will be given to you. Sometimes symptoms of tonsillitis can be relieved with the use of steroid medicine. If your tonsillitis is severe and happens often, you may need to get your tonsils removed (tonsillectomy). HOME CARE   Rest and sleep often.  Drink enough fluids to keep your pee (urine) clear or pale yellow.  While your throat is sore, eat soft or liquid foods like:  Soup.  Ice cream.  Instant breakfast drinks.  Eat frozen ice pops.  Gargle with a warm or cold liquid to help soothe the throat. Gargle with a water and salt mix. Mix 1/4 teaspoon of salt and 1/4 teaspoon of baking soda in 1 cup of water.  Only take medicines as told by your doctor.  If you are given medicines (antibiotics), take them as  told. Doreatha Martin them even if you start to feel better. GET HELP IF:  You have large, tender lumps in your neck.  You have a rash.  You cough up green, yellow-brown, or bloody fluid.  You cannot swallow liquids or food for 24 hours.  You notice that only one of your tonsils is swollen. GET HELP RIGHT AWAY IF:   You throw up (vomit).  You have a very bad headache.  You have a stiff neck.  You have chest pain.  You have trouble breathing or swallowing.  You have bad throat pain,  drooling, or your voice changes.  You have bad pain not helped by medicine.  You cannot fully open your mouth.  You have redness, puffiness, or bad pain in the neck.  You have a fever. MAKE SURE YOU:   Understand these instructions.  Will watch your condition.  Will get help right away if you are not doing well or get worse.   This information is not intended to replace advice given to you by your health care provider. Make sure you discuss any questions you have with your health care provider.   Document Released: 05/22/2008 Document Revised: 12/09/2013 Document Reviewed: 05/23/2013 Elsevier Interactive Patient Education 2016 Elsevier Inc.  Sore Throat A sore throat is a painful, burning, sore, or scratchy feeling of the throat. There may be pain or tenderness when swallowing or talking. You may have other symptoms with a sore throat. These include coughing, sneezing, fever, or a swollen neck. A sore throat is often the first sign of another sickness. These sicknesses may include a cold, flu, strep throat, or an infection called mono. Most sore throats go away without medical treatment.  HOME CARE   Only take medicine as told by your doctor.  Drink enough fluids to keep your pee (urine) clear or pale yellow.  Rest as needed.  Try using throat sprays, lozenges, or suck on hard candy (if older than 4 years or as told).  Sip warm liquids, such as broth, herbal tea, or warm water with honey. Try sucking on frozen ice pops or drinking cold liquids.  Rinse the mouth (gargle) with salt water. Mix 1 teaspoon salt with 8 ounces of water.  Do not smoke. Avoid being around others when they are smoking.  Put a humidifier in your bedroom at night to moisten the air. You can also turn on a hot shower and sit in the bathroom for 5-10 minutes. Be sure the bathroom door is closed. GET HELP RIGHT AWAY IF:   You have trouble breathing.  You cannot swallow fluids, soft foods, or your spit  (saliva).  You have more puffiness (swelling) in the throat.  Your sore throat does not get better in 7 days.  You feel sick to your stomach (nauseous) and throw up (vomit).  You have a fever or lasting symptoms for more than 2-3 days.  You have a fever and your symptoms suddenly get worse. MAKE SURE YOU:   Understand these instructions.  Will watch your condition.  Will get help right away if you are not doing well or get worse.   This information is not intended to replace advice given to you by your health care provider. Make sure you discuss any questions you have with your health care provider.   Document Released: 09/12/2008 Document Revised: 08/28/2012 Document Reviewed: 08/11/2012 Elsevier Interactive Patient Education [employer]

## 2016-01-05 NOTE — ED Notes (Signed)
Pt reports sore throat x 2 days, denies fever. Airway intact.

## 2016-01-05 NOTE — ED Provider Notes (Signed)
CSN: [id number]     Arrival date & time 01/05/16  1751 History  By signing my name below, I, Evon Slack, attest that this documentation has been prepared under the direction and in the presence of Cayton Cuevas Camprubi-Soms, PA-C. Electronically Signed: Evon Slack, ED Scribe. 01/05/2016. 6:31 PM.     Chief Complaint  Patient presents with  . Sore Throat    Patient is a 25 y.o. female presenting with pharyngitis. The history is provided by the patient.  Sore Throat This is a new problem. The current episode started 2 days ago. The problem occurs constantly. The problem has not changed since onset.Pertinent negatives include no chest pain, no abdominal pain and no shortness of breath. The symptoms are aggravated by swallowing. Nothing relieves the symptoms. She has tried nothing for the symptoms. The treatment provided no relief.   HPI Comments: Karen Randall is a 25 y.o. female who presents to the Emergency Department complaining of 5/10 nonradiating scratchy/sore posterior throat pain onset 2 days prior. She states that the pain is worse when swallowing. Pt denies trying any medications PTA. Pt states she has a Hx of strep throat and that her pain feels similar to previous strep throat. Pt denies any recent sick contacts or traveling. Denies trouble swallowing, drooling, trismus, rhinorrhea, ear pain, ear drainage, CP, SOB, fever, chills, cough, abdominal pain, n/v/d/c, dysuria, hematuria, numbness, tingling, or weakness. Denies rashes or myalgias/arthralgias.     History reviewed. No pertinent past medical history. History reviewed. No pertinent past surgical history. History reviewed. No pertinent family history. Social History  Substance Use Topics  . Smoking status: Never Smoker   . Smokeless tobacco: None  . Alcohol Use: Yes     Comment: socially   OB History    No data available     Review of Systems  Constitutional: Negative for fever and chills.  HENT: Positive for  sore throat. Negative for drooling, ear discharge, ear pain, rhinorrhea and trouble swallowing.   Respiratory: Negative for cough and shortness of breath.   Cardiovascular: Negative for chest pain.  Gastrointestinal: Negative for nausea, vomiting, abdominal pain, diarrhea and constipation.  Genitourinary: Negative for dysuria and hematuria.  Musculoskeletal: Negative for myalgias and arthralgias.  Skin: Negative for color change.  Allergic/Immunologic: Negative for immunocompromised state.  Neurological: Negative for weakness and numbness.  Psychiatric/Behavioral: Negative for confusion.   10 Systems reviewed and all are negative for acute change except as noted in the HPI.    Allergies  Latex  Home Medications   Prior to Admission medications   Medication Sig Start Date End Date Taking? Authorizing Provider  acetaminophen (TYLENOL) 500 MG tablet Take 500 mg by mouth every 6 (six) hours as needed for mild pain.    Historical Provider, MD  Probiotic Product (PROBIOTIC & ACIDOPHILUS EX ST PO) Take 1 capsule by mouth daily.    Historical Provider, MD   BP 124/70 mmHg  Pulse 86  Temp(Src) 97.5 F (36.4 C) (Oral)  Resp 18  Ht  (1.549 m)  Wt 145 lb (65.772 kg)  BMI 27.41 kg/m2  SpO2 98%  LMP 12/29/2015   Physical Exam  Constitutional: She is oriented to person, place, and time. Vital signs are normal. She appears well-developed and well-nourished.  Non-toxic appearance. No distress.  Afebrile, nontoxic, NAD  HENT:  Head: Normocephalic and atraumatic.  Nose: Nose normal.  Mouth/Throat: Uvula is midline and mucous membranes are normal. No trismus in the jaw. No uvula swelling. Oropharyngeal exudate, posterior  oropharyngeal edema and posterior oropharyngeal erythema present. No tonsillar abscesses.  Nose clear. Oropharynx without uvular swelling or deviation, no trismus or drooling, with 2+ bilateral tonsillar swelling and erythema, +exudates, no evidence of PTA.   Eyes:  Conjunctivae and EOM are normal. Right eye exhibits no discharge. Left eye exhibits no discharge.  Neck: Normal range of motion. Neck supple.  Cardiovascular: Normal rate.   Pulmonary/Chest: Effort normal. No respiratory distress.  Abdominal: Normal appearance. She exhibits no distension.  Musculoskeletal: Normal range of motion.  Lymphadenopathy:       Head (right side): Tonsillar adenopathy present.       Head (left side): Tonsillar adenopathy present.    She has cervical adenopathy.  bilateral tonsillar and cervical LAD which is midly TTP  Neurological: She is alert and oriented to person, place, and time. She has normal strength. No sensory deficit.  Skin: Skin is warm, dry and intact. No rash noted.  Psychiatric: She has a normal mood and affect. Her behavior is normal.  Nursing note and vitals reviewed.   ED Course  Procedures (including critical care time) DIAGNOSTIC STUDIES: Oxygen Saturation is 98% on RA, normal by my interpretation.    COORDINATION OF CARE: 6:33 PM-Discussed treatment plan with pt at bedside and pt agreed to plan.      Labs Review Labs Reviewed  RAPID STREP SCREEN (NOT AT [facility])  CULTURE, GROUP A STREP [facility])    Imaging Review No results found.    EKG Interpretation None      MDM   Final diagnoses:  Pharyngitis  Tonsillitis with exudate  Sore throat     25 y.o. female here with sore throat x2 days with 2+tonsillar swelling, erythema, and exudates. Some LAD. Afebrile and handling secretions well, no evidence of ludwig's or PTA. CENTOR criteria moderate, will obtain RST. Pt declines wanting anything for pain. Will reassess shortly.   7:42 PM RST neg. Discussed that lab will call her if it cultures out positive, but that this could be a viral pharyngitis. Doubt need for empiric abx at this time. Pt is agreeable to symptomatic treatment with close follow up with [facility] to establish care in 1wk, and as needed but spoke at length about emergent  changing or worsening of symptoms that should prompt return to ER. Pt voices understanding and is agreeable to plan. Stable at time of discharge.   I personally performed the services described in this documentation, which was scribed in my presence. The recorded information has been reviewed and is accurate.    BP 124/70 mmHg  Pulse 86  Temp(Src) 97.5 F (36.4 C) (Oral)  Resp 18  Ht  (1.549 m)  Wt 65.772 kg  BMI 27.41 kg/m2  SpO2 98%  LMP 12/29/2015  No orders of the defined types were placed in this encounter.     France Ravens Camprubi-Soms, PA-C 01/05/16 1943  Doug Sou, MD 01/05/16 2342

## 2016-01-05 NOTE — ED Notes (Signed)
Pt c/o sore throat x's 2-3 days with white spots on her tonsils  Pt denies any fevers

## 2016-01-08 LAB — CULTURE, GROUP A STREP (THRC)

## 2017-03-01 ENCOUNTER — Encounter (HOSPITAL_COMMUNITY): Payer: Self-pay | Admitting: Neurology

## 2017-03-01 ENCOUNTER — Emergency Department (HOSPITAL_COMMUNITY)
Admission: EM | Admit: 2017-03-01 | Discharge: 2017-03-01 | Disposition: A | Discharge status: Home / Self Care | Payer: No Typology Code available for payment source | Attending: Emergency Medicine | Admitting: Emergency Medicine

## 2017-03-01 DIAGNOSIS — Z91040 Latex allergy status: Secondary | ICD-10-CM | POA: Insufficient documentation

## 2017-03-01 DIAGNOSIS — B3731 Acute candidiasis of vulva and vagina: Secondary | ICD-10-CM

## 2017-03-01 DIAGNOSIS — B9689 Other specified bacterial agents as the cause of diseases classified elsewhere: Secondary | ICD-10-CM | POA: Insufficient documentation

## 2017-03-01 DIAGNOSIS — N760 Acute vaginitis: Secondary | ICD-10-CM | POA: Insufficient documentation

## 2017-03-01 DIAGNOSIS — B373 Candidiasis of vulva and vagina: Secondary | ICD-10-CM

## 2017-03-01 LAB — WET PREP, GENITAL
Sperm: NONE SEEN
TRICH WET PREP: NONE SEEN

## 2017-03-01 MED ORDER — METRONIDAZOLE 500 MG PO TABS
500.0000 mg | ORAL_TABLET | Freq: Two times a day (BID) | ORAL | 0 refills | Status: DC
Start: 2017-03-01 — End: 2017-05-01

## 2017-03-01 MED ORDER — FLUCONAZOLE 100 MG PO TABS
200.0000 mg | ORAL_TABLET | Freq: Once | ORAL | Status: AC
  Administered 2017-03-01: 200 mg via ORAL
  Filled 2017-03-01: qty 2

## 2017-03-01 MED ORDER — TERCONAZOLE 0.8 % VA CREA: 1.0000 | TOPICAL_CREAM | Freq: Every day | VAGINAL | 0 refills | Status: DC

## 2017-03-01 NOTE — ED Provider Notes (Signed)
[facility]-EMER[facility] DEPT Provider Note   By signing my name below, I, Earmon PhoenixJennifer Waddell, attest that this documentation has been prepared under the direction and in the presence of [facility] Neese, [place]. Electronically Signed: Earmon PhoenixJennifer Waddell, ED Scribe. 03/01/17. 8:24 PM.   History   Chief Complaint Chief Complaint  Patient presents with  . Vaginal Discharge   The history is provided by the patient and medical records. No language interpreter was used.   HPI Comments: Karen Randall is a 26 y.o. female who presents to the Emergency Department complaining of white vaginal discharge that began last night. She reports associated vaginal irritation and pain that began two days ago. She has not taken anything for pain relief. There are no modifying factors reported. She denies vaginal bleeding, abdominal pain, nausea, vomiting, fever, chills, back pain, HA, cough, cold, congestion, dysuria. LMP was 01/26/17. She is sexually active and does not use birth control. She reports being with the same partner for the past 10 years. She reports only one pregnancy.   History reviewed. No pertinent past medical history.  Patient Active Problem List   Diagnosis Date Noted  . VAGINAL DISCHARGE 04/29/2009   History reviewed. No pertinent surgical history.  OB History    No data available     Home Medications    Prior to Admission medications   Medication Sig Start Date End Date Taking? Authorizing Provider  acetaminophen (TYLENOL) 500 MG tablet Take 500 mg by mouth every 6 (six) hours as needed for mild pain.    Historical Provider, MD  metroNIDAZOLE (FLAGYL) 500 MG tablet Take 1 tablet (500 mg total) by mouth 2 (two) times daily. 03/01/17   Hope Orlene OchM Neese, NP  Probiotic Product (PROBIOTIC & ACIDOPHILUS EX ST PO) Take 1 capsule by mouth daily.    Historical Provider, MD  terconazole (TERAZOL 3) 0.8 % vaginal cream Place 1 applicator vaginally at bedtime. 03/01/17   Hope Orlene OchM Neese, NP    Family History No  family history on file.  Social History Social History  Substance Use Topics  . Smoking status: Never Smoker  . Smokeless tobacco: Not on file  . Alcohol use Yes     Comment: socially     Allergies   Latex   Review of Systems Review of Systems  Constitutional: Negative for chills and fever.  HENT: Negative for congestion.   Respiratory: Negative for cough.   Gastrointestinal: Negative for abdominal pain, nausea and vomiting.  Genitourinary: Positive for vaginal discharge and vaginal pain. Negative for dysuria and vaginal bleeding.  Musculoskeletal: Negative for back pain.  Neurological: Negative for headaches.  All other systems reviewed and are negative.  Physical Exam Updated Vital Signs BP 110/80 (BP Location: Left Arm)   Pulse 90   Temp 98.3 F (36.8 C) (Oral)   Resp 16   LMP 02/05/2017   SpO2 100%   Physical Exam  Constitutional: She appears well-developed and well-nourished.  HENT:  Head: Normocephalic and atraumatic.  Eyes: EOM are normal.  Neck: Neck supple.  Cardiovascular: Normal rate.   Pulmonary/Chest: Effort normal.  Abdominal: Soft. There is no tenderness.  Genitourinary: Uterus normal. Pelvic exam was performed with patient supine. There is no rash, tenderness, lesion or injury on the right labia. There is no rash, tenderness, lesion or injury on the left labia. Cervix exhibits no motion tenderness. Right adnexum displays no mass and no tenderness. Left adnexum displays no mass and no tenderness. Vaginal discharge found.  Genitourinary Comments: External genitalia without lesions,  vaginal vault with thick, white, cheesy, malodorous discharge. No CMT, no adnexal tenderness or mass palpated.   Musculoskeletal: Normal range of motion. She exhibits no edema.  Neurological: She is alert.  Skin: Skin is warm and dry.  Psychiatric: She has a normal mood and affect. Her behavior is normal.  Nursing note and vitals reviewed.    ED Treatments / Results    DIAGNOSTIC STUDIES: Oxygen Saturation is 100% on RA, normal by my interpretation.   COORDINATION OF CARE: 6:59 PM- Will perform pelvic exam and STD panel. Pt verbalizes understanding and agrees to plan.  Medications  fluconazole (DIFLUCAN) tablet 200 mg (not administered)    Labs (all labs ordered are listed, but only abnormal results are displayed) Labs Reviewed  WET PREP, GENITAL - Abnormal; Notable for the following:       Result Value   Yeast Wet Prep HPF POC PRESENT (*)    Clue Cells Wet Prep HPF POC PRESENT (*)    WBC, Wet Prep HPF POC MANY (*)    All other components within normal limits  RPR  HIV ANTIBODY (ROUTINE TESTING)  GC/CHLAMYDIA PROBE AMP ([place]) NOT AT [facility]   Radiology No results found.  Procedures Pelvic exam Date/Time: 03/01/2017 7:32 PM Performed by: Janne Napoleon Authorized by: Janne Napoleon  Consent: Verbal consent obtained. Consent given by: patient Patient understanding: patient states understanding of the procedure being performed Patient consent: the patient's understanding of the procedure matches consent given Procedure consent: procedure consent matches procedure scheduled Relevant documents: relevant documents present and verified Test results: test results available and properly labeled Required items: required blood products, implants, devices, and special equipment available Patient identity confirmed: verbally with patient Preparation: Patient was prepped and draped in the usual sterile fashion. Local anesthesia used: no  Anesthesia: Local anesthesia used: no  Sedation: Patient sedated: no Patient tolerance: Patient tolerated the procedure well with no immediate complications    (including critical care time)  Medications Ordered in ED Medications  fluconazole (DIFLUCAN) tablet 200 mg (not administered)     Initial Impression / Assessment and Plan / ED Course  I have reviewed the triage vital signs and the nursing  notes.  Pertinent lab results that were available during my care of the patient were reviewed by me and considered in my medical decision making (see chart for details). Pt presents for vaginal discharge that began last night and vaginal pain and irritation that began two days ago. Pt requested STD testing while here. Pt advised on safe sex practices and understands that they have GC/Chlamydia cultures pending and will result in 2-3 days. HIV and RPR sent. Pt encouraged to follow up at local health department for future STI checks. No concern for PID. Discussed return precautions. Pt appears safe for discharge. Will treat for yeast infection and BV. I personally performed the services described in this documentation, which was scribed in my presence. The recorded information has been reviewed and is accurate.   Final Clinical Impressions(s) / ED Diagnoses   Final diagnoses:  BV (bacterial vaginosis)  Yeast vaginitis    New Prescriptions New Prescriptions   METRONIDAZOLE (FLAGYL) 500 MG TABLET    Take 1 tablet (500 mg total) by mouth 2 (two) times daily.   TERCONAZOLE (TERAZOL 3) 0.8 % VAGINAL CREAM    Place 1 applicator vaginally at bedtime.      [place], [place] 03/01/17 2031    Canary Brim Tegeler, MD 03/01/17 [phone]

## 2017-03-01 NOTE — ED Triage Notes (Signed)
Pt reports vaginal pain, d/c, irritation, and swelling since Tuesday.

## 2017-03-02 LAB — HIV ANTIBODY (ROUTINE TESTING W REFLEX): HIV SCREEN 4TH GENERATION: NONREACTIVE

## 2017-03-02 LAB — RPR: RPR Ser Ql: NONREACTIVE

## 2017-03-03 LAB — GC/CHLAMYDIA PROBE AMP (~~LOC~~) NOT AT ARMC
Chlamydia: POSITIVE — AB
NEISSERIA GONORRHEA: NEGATIVE

## 2017-05-01 ENCOUNTER — Encounter (HOSPITAL_COMMUNITY): Payer: Self-pay

## 2017-05-01 ENCOUNTER — Emergency Department (HOSPITAL_COMMUNITY)
Admission: EM | Admit: 2017-05-01 | Discharge: 2017-05-02 | Disposition: A | Discharge status: Home / Self Care | Payer: No Typology Code available for payment source | Attending: Physician Assistant | Admitting: Physician Assistant

## 2017-05-01 DIAGNOSIS — B9689 Other specified bacterial agents as the cause of diseases classified elsewhere: Secondary | ICD-10-CM | POA: Insufficient documentation

## 2017-05-01 DIAGNOSIS — B3731 Acute candidiasis of vulva and vagina: Secondary | ICD-10-CM

## 2017-05-01 DIAGNOSIS — B373 Candidiasis of vulva and vagina: Secondary | ICD-10-CM | POA: Insufficient documentation

## 2017-05-01 DIAGNOSIS — N760 Acute vaginitis: Secondary | ICD-10-CM | POA: Insufficient documentation

## 2017-05-01 DIAGNOSIS — Z91040 Latex allergy status: Secondary | ICD-10-CM | POA: Insufficient documentation

## 2017-05-01 LAB — WET PREP, GENITAL
SPERM: NONE SEEN
Trich, Wet Prep: NONE SEEN

## 2017-05-01 LAB — PREGNANCY, URINE: PREG TEST UR: NEGATIVE

## 2017-05-01 MED ORDER — HYDROCORTISONE 1 % EX CREA
TOPICAL_CREAM | Freq: Once | CUTANEOUS | Status: AC
  Administered 2017-05-01: 1 via TOPICAL
  Filled 2017-05-01: qty 28

## 2017-05-01 MED ORDER — METRONIDAZOLE 500 MG PO TABS
500.0000 mg | ORAL_TABLET | Freq: Two times a day (BID) | ORAL | 0 refills | Status: DC
Start: 2017-05-01 — End: 2020-11-03

## 2017-05-01 MED ORDER — FLUCONAZOLE 200 MG PO TABS: ORAL_TABLET | ORAL | 0 refills | Status: DC

## 2017-05-01 NOTE — ED Provider Notes (Signed)
[facility] Provider Note   CSN: [id number] Arrival date & time: 05/01/17  1741     History   Chief Complaint Chief Complaint  Patient presents with  . Vaginal Discharge    HPI Karen Randall is a 26 y.o. female.  Patient reports having a white vaginal discharge over the last several days. Reports it feels like her prior yeast infection. Also has a history of bacterial vaginosis. Denies any new recent sexual partners. Denies any vaginal bleeding. No urinary symptoms. No fevers or chills. Patient also mentions that she has had a rash on her abdomen over the last couple days. Waxing and waning. Has never had anything like this in the past. Slightly itchy. Nonpainful.   The history is provided by the patient.  Illness  This is a new problem. The current episode started more than 2 days ago (5d ago). The problem occurs constantly. The problem has not changed since onset.Pertinent negatives include no abdominal pain. She has tried nothing for the symptoms.    History reviewed. No pertinent past medical history.  Patient Active Problem List   Diagnosis Date Noted  . VAGINAL DISCHARGE 04/29/2009    History reviewed. No pertinent surgical history.  OB History    No data available       Home Medications    Prior to Admission medications   Medication Sig Start Date End Date Taking? Authorizing Provider  acetaminophen (TYLENOL) 500 MG tablet Take 500 mg by mouth every 6 (six) hours as needed for mild pain.   Yes [provider]  fluconazole (DIFLUCAN) 200 MG tablet Take 200mg  on day 1, and 200mg  on day 4. 05/01/17   Lindalou Hose, MD  metroNIDAZOLE (FLAGYL) 500 MG tablet Take 1 tablet (500 mg total) by mouth 2 (two) times daily. 05/01/17   Lindalou Hose, MD    Family History No family history on file.  Social History Social History  Substance Use Topics  . Smoking status: Never Smoker  . Smokeless tobacco: Never Used  . Alcohol use Yes     Comment:  socially     Allergies   Latex   Review of Systems Review of Systems  Gastrointestinal: Negative for abdominal pain.  Genitourinary: Positive for vaginal discharge. Negative for dysuria, flank pain and vaginal bleeding.  Skin: Positive for rash.     Physical Exam Updated Vital Signs BP 131/81 (BP Location: Left Arm)   Pulse 63   Temp 98.9 F (37.2 C)   Resp 18   Ht 5\' 2"  (1.575 m)   Wt 61.2 kg   LMP 04/11/2017   SpO2 100%   BMI 24.69 kg/m   Physical Exam  Constitutional: She appears well-developed and well-nourished. No distress.  HENT:  Head: Normocephalic and atraumatic.  Eyes: Conjunctivae are normal.  Neck: Neck supple.  Cardiovascular: Normal rate and regular rhythm.   No murmur heard. Pulmonary/Chest: Effort normal and breath sounds normal. No respiratory distress.  Abdominal: Soft. There is no tenderness.    Genitourinary: Pelvic exam was performed with patient supine.  Genitourinary Comments: Thick white discharge on exam. Normal internal exam otherwise. No cervical motion tenderness.  Musculoskeletal: She exhibits no edema.  Neurological: She is alert.  Skin: Skin is warm and dry.  Psychiatric: She has a normal mood and affect.  Nursing note and vitals reviewed.    ED Treatments / Results  Labs (all labs ordered are listed, but only abnormal results are displayed) Labs Reviewed  WET PREP, GENITAL - Abnormal;  Notable for the following:       Result Value   Yeast Wet Prep HPF POC PRESENT (*)    Clue Cells Wet Prep HPF POC PRESENT (*)    WBC, Wet Prep HPF POC TOO NUMEROUS TO COUNT (*)    All other components within normal limits  PREGNANCY, URINE  GC/CHLAMYDIA PROBE AMP ([place]    EKG  EKG Interpretation None       Radiology No results found.  Procedures Procedures (including critical care time)  Medications Ordered in ED Medications  hydrocortisone cream 1 % (1 application Topical Given 05/01/17 2220)      Initial Impression / Assessment and Plan / ED Course  I have reviewed the triage vital signs and the nursing notes.  Pertinent labs & imaging results that were available during my care of the patient were reviewed by me and considered in my medical decision making (see chart for details).     Pelvic exam performed. No cervical motion tenderness. Doubt pelvic inflammatory disease. Wet prep and GC chlamydia collected - positive for BV and candida. We'll treat appropriately. Encouraged her to tell any sexual partners.  As far as the rash on her abdomen, no evidence of infectious cause. Possible folliculitis as the patient reports that occurred after shaving. Given hydrocortisone topical here. Told to follow up with her primary care doctor in the next few days.  Final Clinical Impressions(s) / ED Diagnoses   Final diagnoses:  BV (bacterial vaginosis)  Yeast vaginitis    New Prescriptions New Prescriptions   FLUCONAZOLE (DIFLUCAN) 200 MG TABLET    Take 200mg  on day 1, and 200mg  on day 4.   METRONIDAZOLE (FLAGYL) 500 MG TABLET    Take 1 tablet (500 mg total) by mouth 2 (two) times daily.     Lindalou Hose'Rourke, Autumn Pruitt, MD 05/01/17 2316    Abelino DerrickMackuen, Courteney Lyn, MD 05/03/17 [phone]

## 2017-05-01 NOTE — ED Triage Notes (Signed)
Per pt, Pt is coming from home with complaints of vaginal discomfort and discharge that started on Sunday. Pt reports white discharge.

## 2017-05-02 LAB — GC/CHLAMYDIA PROBE AMP (~~LOC~~) NOT AT ARMC
CHLAMYDIA, DNA PROBE: NEGATIVE
NEISSERIA GONORRHEA: NEGATIVE

## 2019-08-20 ENCOUNTER — Other Ambulatory Visit: Payer: Self-pay | Admitting: *Deleted

## 2019-08-20 DIAGNOSIS — Z20822 Contact with and (suspected) exposure to covid-19: Secondary | ICD-10-CM

## 2019-08-21 LAB — NOVEL CORONAVIRUS, NAA: SARS-CoV-2, NAA: NOT DETECTED

## 2020-11-02 ENCOUNTER — Other Ambulatory Visit: Payer: Self-pay

## 2020-11-02 ENCOUNTER — Inpatient Hospital Stay (HOSPITAL_COMMUNITY): Payer: BLUE CROSS/BLUE SHIELD

## 2020-11-02 ENCOUNTER — Inpatient Hospital Stay (HOSPITAL_COMMUNITY)
Admission: AD | Admit: 2020-11-02 | Discharge: 2020-11-03 | DRG: 776 | Disposition: A | Discharge status: Home / Self Care | Payer: BLUE CROSS/BLUE SHIELD | Attending: Obstetrics and Gynecology | Admitting: Obstetrics and Gynecology

## 2020-11-02 ENCOUNTER — Encounter (HOSPITAL_COMMUNITY): Payer: Self-pay | Admitting: Obstetrics and Gynecology

## 2020-11-02 DIAGNOSIS — R001 Bradycardia, unspecified: Secondary | ICD-10-CM | POA: Diagnosis present

## 2020-11-02 DIAGNOSIS — O1415 Severe pre-eclampsia, complicating the puerperium: Principal | ICD-10-CM | POA: Diagnosis present

## 2020-11-02 DIAGNOSIS — O9081 Anemia of the puerperium: Secondary | ICD-10-CM | POA: Diagnosis present

## 2020-11-02 DIAGNOSIS — R079 Chest pain, unspecified: Secondary | ICD-10-CM | POA: Diagnosis not present

## 2020-11-02 DIAGNOSIS — O99893 Other specified diseases and conditions complicating puerperium: Secondary | ICD-10-CM | POA: Diagnosis not present

## 2020-11-02 DIAGNOSIS — R0789 Other chest pain: Secondary | ICD-10-CM | POA: Diagnosis present

## 2020-11-02 HISTORY — DX: Anemia, unspecified: D64.9

## 2020-11-02 LAB — BRAIN NATRIURETIC PEPTIDE: B Natriuretic Peptide: 308.8 pg/mL — ABNORMAL HIGH (ref 0.0–100.0)

## 2020-11-02 LAB — ECHOCARDIOGRAM COMPLETE
Area-P 1/2: 3.72 cm2
Height: 61 in
S' Lateral: 3.1 cm
Weight: 3014.4 oz

## 2020-11-02 LAB — CBC
HCT: 24.2 % — ABNORMAL LOW (ref 36.0–46.0)
Hemoglobin: 7.5 g/dL — ABNORMAL LOW (ref 12.0–15.0)
MCH: 28.2 pg (ref 26.0–34.0)
MCHC: 31 g/dL (ref 30.0–36.0)
MCV: 91 fL (ref 80.0–100.0)
Platelets: 224 10*3/uL (ref 150–400)
RBC: 2.66 MIL/uL — ABNORMAL LOW (ref 3.87–5.11)
RDW: 15.9 % — ABNORMAL HIGH (ref 11.5–15.5)
WBC: 10.1 10*3/uL (ref 4.0–10.5)
nRBC: 0.2 % (ref 0.0–0.2)

## 2020-11-02 LAB — COMPREHENSIVE METABOLIC PANEL
ALT: 39 U/L (ref 0–44)
AST: 35 U/L (ref 15–41)
Albumin: 2.6 g/dL — ABNORMAL LOW (ref 3.5–5.0)
Alkaline Phosphatase: 85 U/L (ref 38–126)
Anion gap: 12 (ref 5–15)
BUN: 11 mg/dL (ref 6–20)
CO2: 21 mmol/L — ABNORMAL LOW (ref 22–32)
Calcium: 8.6 mg/dL — ABNORMAL LOW (ref 8.9–10.3)
Chloride: 106 mmol/L (ref 98–111)
Creatinine, Ser: 0.76 mg/dL (ref 0.44–1.00)
GFR, Estimated: >60 mL/min (ref >60)
Glucose, Bld: 89 mg/dL (ref 70–99)
Potassium: 3.6 mmol/L (ref 3.5–5.1)
Sodium: 139 mmol/L (ref 135–145)
Total Bilirubin: 0.5 mg/dL (ref 0.3–1.2)
Total Protein: 5.9 g/dL — ABNORMAL LOW (ref 6.5–8.1)

## 2020-11-02 LAB — TROPONIN I (HIGH SENSITIVITY)
Troponin I (High Sensitivity): 5 ng/L (ref <18)
Troponin I (High Sensitivity): 5 ng/L (ref <18)

## 2020-11-02 MED ORDER — LABETALOL HCL 5 MG/ML IV SOLN: 20.0000 mg | INTRAVENOUS | Status: DC | PRN

## 2020-11-02 MED ORDER — HYDRALAZINE HCL 20 MG/ML IJ SOLN
10.0000 mg | INTRAMUSCULAR | Status: DC | PRN
  Administered 2020-11-02: 10 mg via INTRAVENOUS
  Filled 2020-11-02: qty 1

## 2020-11-02 MED ORDER — HYDRALAZINE HCL 20 MG/ML IJ SOLN
5.0000 mg | INTRAMUSCULAR | Status: DC | PRN
  Administered 2020-11-02: 5 mg via INTRAVENOUS
  Filled 2020-11-02: qty 1

## 2020-11-02 MED ORDER — IBUPROFEN 600 MG PO TABS
600.0000 mg | ORAL_TABLET | Freq: Four times a day (QID) | ORAL | Status: DC | PRN
  Administered 2020-11-02 – 2020-11-03 (×3): 600 mg via ORAL
  Filled 2020-11-02 (×3): qty 1

## 2020-11-02 MED ORDER — ALUM & MAG HYDROXIDE-SIMETH 200-200-20 MG/5ML PO SUSP: 30.0000 mL | Freq: Four times a day (QID) | ORAL | Status: DC | PRN

## 2020-11-02 MED ORDER — SIMETHICONE 80 MG PO CHEW: 80.0000 mg | CHEWABLE_TABLET | Freq: Four times a day (QID) | ORAL | Status: DC | PRN

## 2020-11-02 MED ORDER — MAGNESIUM SULFATE BOLUS VIA INFUSION
4.0000 g | Freq: Once | INTRAVENOUS | Status: AC
  Administered 2020-11-02: 4 g via INTRAVENOUS
  Filled 2020-11-02: qty 1000

## 2020-11-02 MED ORDER — LACTATED RINGERS IV SOLN: INTRAVENOUS | Status: DC

## 2020-11-02 MED ORDER — ACETAMINOPHEN 325 MG PO TABS
650.0000 mg | ORAL_TABLET | Freq: Four times a day (QID) | ORAL | Status: DC | PRN
  Administered 2020-11-02 – 2020-11-03 (×3): 650 mg via ORAL
  Filled 2020-11-02 (×3): qty 2

## 2020-11-02 MED ORDER — LABETALOL HCL 5 MG/ML IV SOLN: 40.0000 mg | INTRAVENOUS | Status: DC | PRN

## 2020-11-02 MED ORDER — ZOLPIDEM TARTRATE 5 MG PO TABS: 5.0000 mg | ORAL_TABLET | Freq: Every evening | ORAL | Status: DC | PRN

## 2020-11-02 MED ORDER — OXYCODONE-ACETAMINOPHEN 5-325 MG PO TABS
1.0000 | ORAL_TABLET | ORAL | Status: DC | PRN
  Administered 2020-11-02: 1 via ORAL
  Filled 2020-11-02: qty 1

## 2020-11-02 MED ORDER — IBUPROFEN 800 MG PO TABS: 800.0000 mg | ORAL_TABLET | Freq: Three times a day (TID) | ORAL | Status: DC

## 2020-11-02 MED ORDER — OXYCODONE HCL 5 MG PO TABS
5.0000 mg | ORAL_TABLET | Freq: Four times a day (QID) | ORAL | Status: DC | PRN
  Administered 2020-11-02 – 2020-11-03 (×2): 5 mg via ORAL
  Filled 2020-11-02 (×2): qty 1

## 2020-11-02 MED ORDER — MAGNESIUM SULFATE 40 GM/1000ML IV SOLN
2.0000 g/h | INTRAVENOUS | Status: AC
  Administered 2020-11-02: 2 g/h via INTRAVENOUS
  Filled 2020-11-02 (×2): qty 1000

## 2020-11-02 MED ORDER — DOCUSATE SODIUM 100 MG PO CAPS: 100.0000 mg | ORAL_CAPSULE | Freq: Two times a day (BID) | ORAL | Status: DC | PRN

## 2020-11-02 NOTE — H&P (Signed)
OBSTETRIC ADMISSION HISTORY AND PHYSICAL  Karen Randall is a 29 y.o. female G2P2002 s/p CS 5 days ago presenting with chest pressure and found to have severe postpartum preeclampsia after MAU evaluation. She denies SOB, RUQ pain, HA, and visual disturbance.  Prenatal History/Complications: - care at [facility] - anemia - oligohydramnios - GBS positive - alpha thal carrier - CS performed d/t fetal intolerance after IOL  Past Medical History: Past Medical History:  Diagnosis Date  . Anemia     Past Surgical History: Past Surgical History:  Procedure Laterality Date  . CESAREAN SECTION      Obstetrical History: OB History    Gravida  2   Para  2   Term  2   Preterm      AB      Living  2     SAB      TAB      Ectopic      Multiple      Live Births  2           Social History: Social History   Socioeconomic History  . Marital status: Single    Spouse name: Not on file  . Number of children: Not on file  . Years of education: Not on file  . Highest education level: Not on file  Occupational History  . Not on file  Tobacco Use  . Smoking status: Never Smoker  . Smokeless tobacco: Never Used  Substance and Sexual Activity  . Alcohol use: Yes    Comment: socially  . Drug use: No  . Sexual activity: Yes    Birth control/protection: None  Other Topics Concern  . Not on file  Social History Narrative  . Not on file   Social Determinants of Health   Financial Resource Strain:   . Difficulty of Paying Living Expenses: Not on file  Food Insecurity:   . Worried About Programme researcher, broadcasting/film/video in the Last Year: Not on file  . Ran Out of Food in the Last Year: Not on file  Transportation Needs:   . Lack of Transportation (Medical): Not on file  . Lack of Transportation (Non-Medical): Not on file  Physical Activity:   . Days of Exercise per Week: Not on file  . Minutes of Exercise per Session: Not on file  Stress:   . Feeling of Stress : Not  on file  Social Connections:   . Frequency of Communication with Friends and Family: Not on file  . Frequency of Social Gatherings with Friends and Family: Not on file  . Attends Religious Services: Not on file  . Active Member of Clubs or Organizations: Not on file  . Attends Banker Meetings: Not on file  . Marital Status: Not on file    Family History: History reviewed. No pertinent family history.  Allergies: Allergies  Allergen Reactions  . Latex     Medications Prior to Admission  Medication Sig Dispense Refill Last Dose  . acetaminophen (TYLENOL) 500 MG tablet Take 500 mg by mouth every 6 (six) hours as needed for mild pain.   11/02/2020 at 2200  . ibuprofen (ADVIL) 200 MG tablet Take 200 mg by mouth every 6 (six) hours as needed.   11/02/2020  . fluconazole (DIFLUCAN) 200 MG tablet Take 200mg  on day 1, and 200mg  on day 4. 2 tablet 0   . metroNIDAZOLE (FLAGYL) 500 MG tablet Take 1 tablet (500 mg total) by mouth 2 (two)  times daily. 14 tablet 0      Review of Systems:  All systems reviewed and negative except as stated in HPI  PE: Blood pressure (!) 160/72, pulse (!) 45, temperature 98.7 F (37.1 C), temperature source Oral, resp. rate 20, height 5\' 1"  (1.549 m), weight 85.5 kg, SpO2 96 %. Patient Vitals for the past 24 hrs:  BP Temp Temp src Pulse Resp SpO2 Height Weight  11/02/20 0330 (!) 160/72 -- -- (!) 45 -- 96 % -- --  11/02/20 0316 (!) 169/77 -- -- (!) 45 -- -- -- --  11/02/20 0242 (!) 145/80 -- -- (!) 47 -- -- -- --  11/02/20 0215 -- -- -- 90 -- -- -- --  11/02/20 0146 (!) 145/72 -- -- (!) 46 -- -- -- --  11/02/20 0131 (!) 163/77 -- -- (!) 46 -- -- -- --  11/02/20 0116 (!) 158/76 -- -- (!) 47 -- -- -- --  11/02/20 0036 (!) 149/77 98.7 F (37.1 C) Oral (!) 50 20 100 % 5\' 1"  (1.549 m) 85.5 kg   General appearance: alert, cooperative and no distress Lungs: CTAB Heart: regular rhythm, brady  Abdomen: soft, non-tender Extremities: Homans sign  is negative, no sign of DVT  Results for orders placed or performed during the hospital encounter of 11/02/20 (from the past 24 hour(s))  CBC   Collection Time: 11/02/20  1:08 AM  Result Value Ref Range   WBC 10.1 4.0 - 10.5 K/uL   RBC 2.66 (L) 3.87 - 5.11 MIL/uL   Hemoglobin 7.5 (L) 12.0 - 15.0 g/dL   HCT 11/04/20 (L) 36 - 46 %   MCV 91.0 80.0 - 100.0 fL   MCH 28.2 26.0 - 34.0 pg   MCHC 31.0 30.0 - 36.0 g/dL   RDW 11/04/20 (H) 93.7 - 90.2 %   Platelets 224 150 - 400 K/uL   nRBC 0.2 0.0 - 0.2 %  Comprehensive metabolic panel   Collection Time: 11/02/20  1:08 AM  Result Value Ref Range   Sodium 139 135 - 145 mmol/L   Potassium 3.6 3.5 - 5.1 mmol/L   Chloride 106 98 - 111 mmol/L   CO2 21 (L) 22 - 32 mmol/L   Glucose, Bld 89 70 - 99 mg/dL   BUN 11 6 - 20 mg/dL   Creatinine, Ser 73.5 0.44 - 1.00 mg/dL   Calcium 8.6 (L) 8.9 - 10.3 mg/dL   Total Protein 5.9 (L) 6.5 - 8.1 g/dL   Albumin 2.6 (L) 3.5 - 5.0 g/dL   AST 35 15 - 41 U/L   ALT 39 0 - 44 U/L   Alkaline Phosphatase 85 38 - 126 U/L   Total Bilirubin 0.5 0.3 - 1.2 mg/dL   GFR, Estimated 11/04/20 3.29 mL/min   Anion gap 12 5 - 15  Brain natriuretic peptide   Collection Time: 11/02/20  1:08 AM  Result Value Ref Range   B Natriuretic Peptide 308.8 (H) 0.0 - 100.0 pg/mL  Troponin I (High Sensitivity)   Collection Time: 11/02/20  1:08 AM  Result Value Ref Range   Troponin I (High Sensitivity) 5 <18 ng/L    Patient Active Problem List   Diagnosis Date Noted  . Hypertension in pregnancy, preeclampsia, severe, delivered/postpartum 11/02/2020  . VAGINAL DISCHARGE 04/29/2009   Assessment: Postpartum preeclampsia, severe Bradycardia Elevated BNP  Plan: Admit to [facility] unit MgSO4 IV Hydralazine Cardiology consult CXR  11/04/2020, CNM  11/02/2020, 4:13 AM

## 2020-11-02 NOTE — MAU Provider Note (Signed)
History     CSN: [id number]  Arrival date and time: 11/02/20 8242   First Provider Initiated Contact with Patient 11/02/20 0059      Chief Complaint  Patient presents with  . Chest Pain   29 y.o. [id number] s/p CS 5 days ago presenting with chest pressure. Sx started earlier today. Reports feeling substernal pressure upon deep inspiration. Denies SOB. Denies HA, visual disturbances, RUQ pain, SOB, and CP. She is tolerating po and passing flatus. She is taking Ibuprofen and Tylenol for incisional pain.   OB History    Gravida  2   Para  2   Term  2   Preterm      AB      Living  2     SAB      TAB      Ectopic      Multiple      Live Births  2           Past Medical History:  Diagnosis Date  . Anemia     Past Surgical History:  Procedure Laterality Date  . CESAREAN SECTION      History reviewed. No pertinent family history.  Social History   Tobacco Use  . Smoking status: Never Smoker  . Smokeless tobacco: Never Used  Substance Use Topics  . Alcohol use: Yes    Comment: socially  . Drug use: No    Allergies:  Allergies  Allergen Reactions  . Latex     Medications Prior to Admission  Medication Sig Dispense Refill Last Dose  . acetaminophen (TYLENOL) 500 MG tablet Take 500 mg by mouth every 6 (six) hours as needed for mild pain.   11/02/2020 at 2200  . ibuprofen (ADVIL) 200 MG tablet Take 200 mg by mouth every 6 (six) hours as needed.   11/02/2020  . fluconazole (DIFLUCAN) 200 MG tablet Take 200mg  on day 1, and 200mg  on day 4. 2 tablet 0   . metroNIDAZOLE (FLAGYL) 500 MG tablet Take 1 tablet (500 mg total) by mouth 2 (two) times daily. 14 tablet 0     Review of Systems  Eyes: Negative for visual disturbance.  Respiratory: Negative for shortness of breath.   Cardiovascular: Positive for chest pain. Negative for leg swelling.  Neurological: Negative for headaches.   Physical Exam   Blood pressure (!) 160/72, pulse (!) 45, temperature  98.7 F (37.1 C), temperature source Oral, resp. rate 20, height 5\' 1"  (1.549 m), weight 85.5 kg, SpO2 96 %. Patient Vitals for the past 24 hrs:  BP Temp Temp src Pulse Resp SpO2 Height Weight  11/02/20 0330 (!) 160/72 -- -- (!) 45 -- 96 % -- --  11/02/20 0316 (!) 169/77 -- -- (!) 45 -- -- -- --  11/02/20 0242 (!) 145/80 -- -- (!) 47 -- -- -- --  11/02/20 0215 -- -- -- 90 -- -- -- --  11/02/20 0146 (!) 145/72 -- -- (!) 46 -- -- -- --  11/02/20 0131 (!) 163/77 -- -- (!) 46 -- -- -- --  11/02/20 0116 (!) 158/76 -- -- (!) 47 -- -- -- --  11/02/20 0036 (!) 149/77 98.7 F (37.1 C) Oral (!) 50 20 100 % 5\' 1"  (1.549 m) 85.5 kg    Physical Exam Constitutional:      General: She is not in acute distress.    Appearance: Normal appearance.  Cardiovascular:     Rate and Rhythm: Regular rhythm. Bradycardia present.  Heart sounds: Normal heart sounds.  Pulmonary:     Effort: Pulmonary effort is normal. No respiratory distress.     Breath sounds: No wheezing, rhonchi or rales.  Musculoskeletal:        General: Normal range of motion.     Cervical back: Normal range of motion.     Right lower leg: Edema (trace) present.     Left lower leg: Edema (trace) present.  Skin:    General: Skin is warm and dry.  Neurological:     General: No focal deficit present.     Mental Status: She is alert and oriented to person, place, and time.  Psychiatric:        Mood and Affect: Mood normal.        Behavior: Behavior normal.    Results for orders placed or performed during the hospital encounter of 11/02/20 (from the past 24 hour(s))  CBC     Status: Abnormal   Collection Time: 11/02/20  1:08 AM  Result Value Ref Range   WBC 10.1 4.0 - 10.5 K/uL   RBC 2.66 (L) 3.87 - 5.11 MIL/uL   Hemoglobin 7.5 (L) 12.0 - 15.0 g/dL   HCT 16.1 (L) 36 - 46 %   MCV 91.0 80.0 - 100.0 fL   MCH 28.2 26.0 - 34.0 pg   MCHC 31.0 30.0 - 36.0 g/dL   RDW 09.6 (H) 04.5 - 40.9 %   Platelets 224 150 - 400 K/uL   nRBC 0.2  0.0 - 0.2 %  Comprehensive metabolic panel     Status: Abnormal   Collection Time: 11/02/20  1:08 AM  Result Value Ref Range   Sodium 139 135 - 145 mmol/L   Potassium 3.6 3.5 - 5.1 mmol/L   Chloride 106 98 - 111 mmol/L   CO2 21 (L) 22 - 32 mmol/L   Glucose, Bld 89 70 - 99 mg/dL   BUN 11 6 - 20 mg/dL   Creatinine, Ser 8.11 0.44 - 1.00 mg/dL   Calcium 8.6 (L) 8.9 - 10.3 mg/dL   Total Protein 5.9 (L) 6.5 - 8.1 g/dL   Albumin 2.6 (L) 3.5 - 5.0 g/dL   AST 35 15 - 41 U/L   ALT 39 0 - 44 U/L   Alkaline Phosphatase 85 38 - 126 U/L   Total Bilirubin 0.5 0.3 - 1.2 mg/dL   GFR, Estimated >91 >47 mL/min   Anion gap 12 5 - 15  Brain natriuretic peptide     Status: Abnormal   Collection Time: 11/02/20  1:08 AM  Result Value Ref Range   B Natriuretic Peptide 308.8 (H) 0.0 - 100.0 pg/mL  Troponin I (High Sensitivity)     Status: None   Collection Time: 11/02/20  1:08 AM  Result Value Ref Range   Troponin I (High Sensitivity) 5 <18 ng/L   MAU Course  Procedures  MDM Labs and EKG ordered.  0140: EKG read by Dr. Meredeth Ide sinus bradycardia. Pt has baseline HR of 70-80s per chart.  8295: Consult with Dr. Alysia Penna, recommend formal cardiology consult. 0230: Dr. Meredeth Ide returned page, will come evaluate pt 0300: TC from Dr. Meredeth Ide, will come evaluate pt after second Troponin resulted 0355: Consult with Dr. Alysia Penna d/t 2 consecutive severe range BPs, awaiting cardiology consult and lab results, plan for admit, MgSO4, and IV Hydralazine.   Assessment and Plan   1. Hypertension in pregnancy, preeclampsia, severe, postpartum condition    Admit to [facility] unit See H&P  Donette Larry,  CNM 11/02/2020, 4:07 AM

## 2020-11-02 NOTE — Progress Notes (Signed)
  Echocardiogram 2D Echocardiogram has been performed.  Karen Randall 11/02/2020, 3:15 PM

## 2020-11-02 NOTE — Plan of Care (Signed)
  Problem: Education: Goal: Knowledge of condition will improve Outcome: Progressing Goal: Individualized Newborn Educational Video(s) Outcome: Progressing   Problem: Activity: Goal: Will verbalize the importance of balancing activity with adequate rest periods Outcome: Progressing Goal: Ability to tolerate increased activity will improve Outcome: Progressing   Problem: Coping: Goal: Ability to identify and utilize available resources and services will improve Outcome: Progressing

## 2020-11-02 NOTE — Progress Notes (Signed)
HD # 1 POD # 5 LTCS d/t fetal intolerance of labor SPEC Bradycardia  Subjective: Karen Randall is without complaints this morning. Denies HA, visual changes or epigastric pain.   Objective: Blood pressure 125/72, pulse (!) 48, temperature 99 F (37.2 C), temperature source Oral, resp. rate 18, height 5\' 1"  (1.549 m), weight 85.5 kg, SpO2 99 %.  Physical Exam:  General: alert Lochia: appropriate Uterine Fundus: firm Incision: healing well DVT Evaluation: No evidence of DVT seen on physical exam.  Recent Labs    11/02/20 0108  HGB 7.5*  HCT 24.2*    Assessment/Plan: Stable. Magnesium x 24 hrs. Monitor BP. Cardiology consult for bradycardia. Continue with current management   LOS: 0 days   11/04/20 11/02/2020, 6:58 AM

## 2020-11-02 NOTE — MAU Note (Signed)
PT SAYS SHE DEL BY C/S AT 38 WEEKS  ON 10-28-20 AT [facility] .   SHE FEELS CHEST PRESSURE- STARTED THIS AM- CALLED [facility] - TOLD TO COME TO ER.  SHE LIVES IN [place]- SO CAME HERE. C/S- BC- WENT TO GET IRON INFUSION, WAS ON MONITOR- SAW DECREASE FHR-- ADMITTED FOR INDUCTION-  STUCK AT 7 CM- AGAIN DECREASE FHR- OFF PITOCIN- THEN C/S.

## 2020-11-02 NOTE — Consult Note (Addendum)
The patient has been seen in conjunction with Judy Pimple, PA-C. All aspects of care have been considered and discussed. The patient has been personally interviewed, examined, and all clinical data has been reviewed.   Constellation of chest pressure when recumbent, blood pressure elevation, bradycardia (possibly Cushing's reflex), otherwise normal normal EKG, and normal LV & RV function by echocardiography (unofficial reading) speak against pregnancy/female related acute cardiac syndromes.  These could include peripartum cardiomyopathy, spontaneous coronary artery dissection, aortic dissection, and pulmonary embolism.  No evidence for any is present.  No specific cardiac work-up other than what has already been done.  Continue to treat postpartum preeclampsia.  Blood pressure has responded to hydralazine.  She is still volume overloaded so hopefully she will start to diurese, but if this does not occur spontaneously a dose or 2 of an IV diuretic may be helpful.  We will check in again tomorrow.  Cardiology Consultation:   Patient ID: Karen Randall MRN: [id number]; DOB: 1991-05-11  Admit date: 11/02/2020 Date of Consult: 11/02/2020  Primary Care Provider: Patient, No Pcp Per [facility] Cardiologist: New to [facility] Electrophysiologist:  None    Patient Profile:   Karen Randall is a 29 y.o. female with a PMH of anemia and recent delivery of her second child via C-section 10/28/20 who is being seen today for the evaluation of bradycardia at the request of Dr. Alysia Penna.  History of Present Illness:   Karen Randall is 5 days post-partum after delivering a healthy baby boy via C-section 10/28/20. She was discharged home 10/30/20 after an uncomplicated post-op course. She initially felt fine on discharge, however on 11/01/20 she noticed chest pressure which gradually got worse throughout the day prompting her to present to the hospital for further  evaluation.  She has no prior cardiac history. She reports her mother had a mitral valve replacement in her 93s and was recently diagnosed with pancreatic cancer. She denies tobacco, alcohol, or illicit drug use.  At the time of this evaluation she reports improvement in her chest pressure, though it is not completely gone, currently a 1-2/10.  She reported symptoms occurred predominately at rest, worse when laying down, and improved with ambulation. She had some associated SOB. Also with LE edema which improves with elevation of her legs. She does not recall having any trouble with slow heart rates or elevated blood pressures in the past. No complaints of palpitations, dizziness, lightheadedness, or syncope.   On arrival to [facility] she was hypertensive and bradycardic to the 40s, otherwise VSS. Labs notable for K 3.6, Cr 0.76, Hgb 7.5, PLT 224, BNP 308, HsTrop 5 x2. EKG with sinus bradycardia, rate 45 bpm, no STE/D. CXR with tiny bilateral pleural effusions. She was admitted to MAU for pre-eclampsia. She was given magnesium, IVFs, and hydralazine with improvement in blood pressure. HR's have been stable in the 60s-70s since 6am. Cardiology asked to evaluate for bradycardia.     Past Medical History:  Diagnosis Date   Anemia     Past Surgical History:  Procedure Laterality Date   CESAREAN SECTION       Home Medications:  Prior to Admission medications   Medication Sig Start Date End Date Taking? Authorizing Provider  acetaminophen (TYLENOL) 500 MG tablet Take 500 mg by mouth every 6 (six) hours as needed for mild pain.   Yes [provider]  ibuprofen (ADVIL) 200 MG tablet Take 200 mg by mouth every 6 (six) hours as needed.  Yes [provider]  fluconazole (DIFLUCAN) 200 MG tablet Take 200mg  on day 1, and 200mg  on day 4. 05/01/17   , MD  metroNIDAZOLE (FLAGYL) 500 MG tablet Take 1 tablet (500 mg total) by mouth 2 (two) times daily. 05/01/17    Lindalou Hose, MD    Inpatient Medications: Scheduled Meds:  Continuous Infusions:  lactated ringers 25 mL/hr at 11/02/20 0631   magnesium sulfate 2 g/hr (11/02/20 11/04/20)   PRN Meds: acetaminophen, alum & mag hydroxide-simeth, docusate sodium, hydrALAZINE **AND** hydrALAZINE **AND** labetalol **AND** labetalol **AND** Measure blood pressure, ibuprofen, oxyCODONE, simethicone, zolpidem  Allergies:    Allergies  Allergen Reactions   Latex     Social History:   Social History   Socioeconomic History   Marital status: Single    Spouse name: Not on file   Number of children: Not on file   Years of education: Not on file   Highest education level: Not on file  Occupational History   Not on file  Tobacco Use   Smoking status: Never Smoker   Smokeless tobacco: Never Used  Substance and Sexual Activity   Alcohol use: Yes    Comment: socially   Drug use: No   Sexual activity: Yes    Birth control/protection: None  Other Topics Concern   Not on file  Social History Narrative   Not on file   Social Determinants of Health   Financial Resource Strain:    Difficulty of Paying Living Expenses: Not on file  Food Insecurity:    Worried About Running Out of Food in the Last Year: Not on file   11/04/20 of Food in the Last Year: Not on file  Transportation Needs:    Lack of Transportation (Medical): Not on file   Lack of Transportation (Non-Medical): Not on file  Physical Activity:    Days of Exercise per Week: Not on file   Minutes of Exercise per Session: Not on file  Stress:    Feeling of Stress : Not on file  Social Connections:    Frequency of Communication with Friends and Family: Not on file   Frequency of Social Gatherings with Friends and Family: Not on file   Attends Religious Services: Not on file   Active Member of Clubs or Organizations: Not on file   Attends [id number] Meetings: Not on file   Marital Status: Not on file    Intimate Partner Violence:    Fear of Current or Ex-Partner: Not on file   Emotionally Abused: Not on file   Physically Abused: Not on file   Sexually Abused: Not on file    Family History:   History reviewed. No pertinent family history.   ROS:  Please see the history of present illness.   All other ROS reviewed and negative.     Physical Exam/Data:   Vitals:   11/02/20 0900 11/02/20 1007 11/02/20 1057 11/02/20 1204  BP: 116/72 129/72 115/63 110/70  Pulse: 61 63 69 63  Resp: 18 19 20 20   Temp:      TempSrc: Oral     SpO2: 99% 99% 100% 98%  Weight:      Height:        Intake/Output Summary (Last 24 hours) at 11/02/2020 1223 Last data filed at 11/02/2020 1058 Gross per 24 hour  Intake 771.23 ml  Output 2250 ml  Net -1478.77 ml   Last 3 Weights 11/02/2020 05/01/2017 01/05/2016  Weight (lbs) 188 lb 6.4 oz  135 lb 145 lb  Weight (kg) 85.458 kg 61.236 kg 65.772 kg     Body mass index is 35.6 kg/m.  General:  Well nourished, well developed, in no acute distress HEENT: sclera anicteric Neck: no JVD Vascular: No carotid bruits; distal pulses 2+ bilaterally Cardiac:  normal S1, S2; RRR; no murmurs, rubs, or gallops Lungs:  clear to auscultation bilaterally, no wheezing, rhonchi or rales  Abd: soft, protuberant, nontender Ext: no edema Musculoskeletal:  No deformities, BUE and BLE strength normal and equal Skin: warm and dry  Neuro:  CNs 2-12 intact, no focal abnormalities noted Psych:  Normal affect   EKG:  The EKG was personally reviewed and demonstrates:  sinus bradycardia, rate 45 bpm, no STE/D. Telemetry: patient not on telemetry  Relevant CV Studies: Echocardiogram pending  Laboratory Data:  High Sensitivity Troponin:   Recent Labs  Lab 11/02/20 0108 11/02/20 0303  TROPONINIHS 5 5     Chemistry Recent Labs  Lab 11/02/20 0108  NA 139  K 3.6  CL 106  CO2 21*  GLUCOSE 89  BUN 11  CREATININE 0.76  CALCIUM 8.6*  GFRNONAA >60  ANIONGAP 12     Recent Labs  Lab 11/02/20 0108  PROT 5.9*  ALBUMIN 2.6*  AST 35  ALT 39  ALKPHOS 85  BILITOT 0.5   Hematology Recent Labs  Lab 11/02/20 0108  WBC 10.1  RBC 2.66*  HGB 7.5*  HCT 24.2*  MCV 91.0  MCH 28.2  MCHC 31.0  RDW 15.9*  PLT 224   BNP Recent Labs  Lab 11/02/20 0108  BNP 308.8*    DDimer No results for input(s): DDIMER in the last 168 hours.   Radiology/Studies:  DG Chest 2 View  Result Date: 11/02/2020 CLINICAL DATA:  Postpartum chest pain EXAM: CHEST - 2 VIEW COMPARISON:  None. FINDINGS: The lungs are symmetrically well expanded. No pneumothorax. Tiny bilateral pleural effusions are present. Cardiac size within normal limits. Pulmonary vascularity is normal. No acute bone abnormality. IMPRESSION: Tiny bilateral pleural effusions. Electronically Signed   By: Helyn Numbers MD   On: 11/02/2020 06:14     Assessment and Plan:   1. Chest pain: patient presented with chest pain, worse with laying down and improved with ambulation. Some associated SOB. She was hypertensive and bradycardic on arrival. No hypoxia. EKG showed sinus bradycardia but no ischemic changes. HsTrop was negative x2. BNP 308. CXR with tiny bilateral pleural effusions. No risk factors or findings c/w ACS. Differential includes volume overload, HTN mediated, pericarditis, symptomatic anemia, or even PE given recent surgery.  - Will check an echocardiogram to evaluate LV function, wall motion, pericardial effusion, or right heart strain.  - Would have low threshold to check CTA of chest if patient develops worsening CP/SOB or becomes hypoxic.   2. Bradycardia: HR initially in the 40s on arrival, though has been in the 60s-70s throughout the day today. She has no complaints of palpitations, dizziness, lightheadedness, or syncope. This could be secondary effects of recent anesthesia from C-section and hormone changes post-partum.  - Can continue cardiac monitoring while admitted though do not  anticipate further work-up/management.      For questions or updates, please contact [facility] Please consult [email or web address] for contact info under    Signed, Beatriz Stallion, PA-C  11/02/2020 12:23 PM

## 2020-11-03 ENCOUNTER — Telehealth: Payer: Self-pay | Admitting: *Deleted

## 2020-11-03 DIAGNOSIS — O1415 Severe pre-eclampsia, complicating the puerperium: Secondary | ICD-10-CM | POA: Diagnosis not present

## 2020-11-03 MED ORDER — IBUPROFEN 600 MG PO TABS: 600.0000 mg | ORAL_TABLET | Freq: Four times a day (QID) | ORAL | Status: DC | PRN

## 2020-11-03 MED ORDER — DOCUSATE SODIUM 100 MG PO CAPS
100.0000 mg | ORAL_CAPSULE | Freq: Two times a day (BID) | ORAL | 1 refills | Status: DC | PRN
Start: 2020-11-03 — End: 2023-01-02

## 2020-11-03 MED ORDER — FERROUS SULFATE 325 (65 FE) MG PO TABS: 325.0000 mg | ORAL_TABLET | Freq: Every day | ORAL | 0 refills | Status: DC

## 2020-11-03 MED ORDER — ACETAMINOPHEN 325 MG PO TABS: 650.0000 mg | ORAL_TABLET | Freq: Four times a day (QID) | ORAL | Status: DC | PRN

## 2020-11-03 MED ORDER — SODIUM CHLORIDE 0.9 % IV SOLN
500.0000 mg | Freq: Once | INTRAVENOUS | Status: DC
  Filled 2020-11-03: qty 25

## 2020-11-03 MED ORDER — OXYCODONE-ACETAMINOPHEN 5-325 MG PO TABS
1.0000 | ORAL_TABLET | Freq: Four times a day (QID) | ORAL | 0 refills | Status: DC | PRN
Start: 2020-11-03 — End: 2023-01-02

## 2020-11-03 NOTE — MAU Note (Signed)
Pt called MAU on 11/17 at 1312 regarding a prescription for pain medication. Chart entered to see who provider was and RN saw she was admitted to [facility].  Informed pt that RN would transfer call to [facility].

## 2020-11-03 NOTE — Discharge Summary (Signed)
Physician Discharge Summary  Patient ID: Karen Randall MRN: [id number] DOB/AGE: 1991/07/14 29 y.o.  Admit date: 11/02/2020 Discharge date: 11/03/2020  Admission Diagnoses: Chest pain. POD#5 s/p c-section at [facility]. Postpartum severe pre-eclampsia (based on blood pressures). Bradycardia (HR 40s)  Discharge Diagnoses: Resolved postpartum severe pre-eclampsia   Discharged Condition: good  Hospital Course: Patient admitted and started on Mg for 24 hours. She was given IV hydralazine for her severe range BPs. Cardiology consulted with negative echo done, with no further recommendations; patient had nearly 7L of UOP from 11/16-11/17 without use of diuretics.   Patient received Venofer 500mg  x 1 prior to discharge for her anemia.   Consults: Cardiology  Significant Diagnostic Studies: ECHO normal with EF 60-65%. CXR with tiny bilateral pleural effusions. BNP 309, negative troponin I x 2. CBC Latest Ref Rng & Units 11/02/2020 09/20/2009 09/18/2009  WBC 4.0 - 10.5 K/uL 10.1 21.1(H) 16.1(H)  Hemoglobin 12.0 - 15.0 g/dL 7.5(L) 8.7 DELTA CHECK NOTED(L) 10.9(L)  Hematocrit 36 - 46 % 24.2(L) 26.1(L) 33.2(L)  Platelets 150 - 400 K/uL 224 168 210   CMP Latest Ref Rng & Units 11/02/2020  Glucose 70 - 99 mg/dL 89  BUN 6 - 20 mg/dL 11  Creatinine 11/04/2020 - 1.69 mg/dL 6.78  Sodium 9.38 - 101 mmol/L 139  Potassium 3.5 - 5.1 mmol/L 3.6  Chloride 98 - 111 mmol/L 106  CO2 22 - 32 mmol/L 21(L)  Calcium 8.9 - 10.3 mg/dL 751)  Total Protein 6.5 - 8.1 g/dL 5.9(L)  Total Bilirubin 0.3 - 1.2 mg/dL 0.5  Alkaline Phos 38 - 126 U/L 85  AST 15 - 41 U/L 35  ALT 0 - 44 U/L 39   Treatments: Mg, IV hydralazine  Discharge Exam: Blood pressure 111/81, pulse 66, temperature 98.4 F (36.9 C), temperature source Oral, resp. rate 18, height 5\' 1"  (1.549 m), weight 85.5 kg, SpO2 98 %. General appearance: alert GI: soft, non-tender; bowel sounds normal; no masses,  no organomegaly Incision/Wound:c/d/i incision  with steri strips in place  Disposition: Discharge disposition: 01-Home or Self Care       Discharge Instructions    Discharge patient   Complete by: As directed    After IV iron   Discharge disposition: 01-Home or Self Care   Discharge patient date: 11/03/2020     Allergies as of 11/03/2020      Reactions   Latex       Medication List    STOP taking these medications   fluconazole 200 MG tablet Commonly known as: DIFLUCAN   metroNIDAZOLE 500 MG tablet Commonly known as: FLAGYL     TAKE these medications   acetaminophen 500 MG tablet Commonly known as: TYLENOL Take 500 mg by mouth every 6 (six) hours as needed for mild pain.   docusate sodium 100 MG capsule Commonly known as: COLACE Take 1 capsule (100 mg total) by mouth 2 (two) times daily as needed for mild constipation or moderate constipation.   ferrous sulfate 325 (65 FE) MG tablet Commonly known as: FerrouSul Take 1 tablet (325 mg total) by mouth daily with breakfast.   ibuprofen 200 MG tablet Commonly known as: ADVIL Take 200 mg by mouth every 6 (six) hours as needed.   oxyCODONE-acetaminophen 5-325 MG tablet Commonly known as: PERCOCET/ROXICET Take 1-2 tablets by mouth every 6 (six) hours as needed.       Follow-up Information    11/05/2020, MD. Go in 7 day(s).   Specialty: Obstetrics and Gynecology Contact  information: [address] [place] [address] [phone]             11/24 follow up visit made for patient  Signed: Aurora Randall 11/03/2020, 10:46 AM

## 2020-11-03 NOTE — Progress Notes (Signed)
Progress Note  Patient Name: Karen Randall Date of Encounter: 11/03/2020  [facility] Cardiologist: No primary care provider on file.   Subjective   Feels better. Had diuresis over night of greater than 4 liters with negative net output of 2.6 L.   Inpatient Medications    Scheduled Meds:  Continuous Infusions: . lactated ringers Stopped (11/03/20 0559)   PRN Meds: acetaminophen, alum & mag hydroxide-simeth, docusate sodium, hydrALAZINE **AND** hydrALAZINE **AND** labetalol **AND** labetalol **AND** Measure blood pressure, ibuprofen, oxyCODONE, simethicone, zolpidem   Vital Signs    Vitals:   11/03/20 0200 11/03/20 0330 11/03/20 0451 11/03/20 0530  BP:   118/78   Pulse:   64   Resp: 17 17 18 18   Temp:   98.5 F (36.9 C)   TempSrc:   Oral   SpO2:   98%   Weight:      Height:        Intake/Output Summary (Last 24 hours) at 11/03/2020 0736 Last data filed at 11/03/2020 0559 Gross per 24 hour  Intake 2506.78 ml  Output 4150 ml  Net -1643.22 ml   Last 3 Weights 11/02/2020 05/01/2017 01/05/2016  Weight (lbs) 188 lb 6.4 oz 135 lb 145 lb  Weight (kg) 85.458 kg 61.236 kg 65.772 kg      Telemetry    None - Personally Reviewed  ECG    Not repeated - Personally Reviewed  Physical Exam   GEN: No acute distress.   Neck: Neck veins are less prominent Cardiac: RRR, no murmurs, rubs, or gallops.  Respiratory: Clear to auscultation bilaterally. GI: Soft, nontender, non-distended  MS: No edema; No deformity. Neuro:  Nonfocal  Psych: Normal affect   Labs    High Sensitivity Troponin:   Recent Labs  Lab 11/02/20 0108 11/02/20 0303  TROPONINIHS 5 5      Chemistry Recent Labs  Lab 11/02/20 0108  NA 139  K 3.6  CL 106  CO2 21*  GLUCOSE 89  BUN 11  CREATININE 0.76  CALCIUM 8.6*  PROT 5.9*  ALBUMIN 2.6*  AST 35  ALT 39  ALKPHOS 85  BILITOT 0.5  GFRNONAA >60  ANIONGAP 12     Hematology Recent Labs  Lab 11/02/20 0108  WBC 10.1  RBC  2.66*  HGB 7.5*  HCT 24.2*  MCV 91.0  MCH 28.2  MCHC 31.0  RDW 15.9*  PLT 224    BNP Recent Labs  Lab 11/02/20 0108  BNP 308.8*     DDimer No results for input(s): DDIMER in the last 168 hours.   Radiology    DG Chest 2 View  Result Date: 11/02/2020 CLINICAL DATA:  Postpartum chest pain EXAM: CHEST - 2 VIEW COMPARISON:  None. FINDINGS: The lungs are symmetrically well expanded. No pneumothorax. Tiny bilateral pleural effusions are present. Cardiac size within normal limits. Pulmonary vascularity is normal. No acute bone abnormality. IMPRESSION: Tiny bilateral pleural effusions. Electronically Signed   By: 11/04/2020 MD   On: 11/02/2020 06:14   ECHOCARDIOGRAM COMPLETE  Result Date: 11/02/2020    ECHOCARDIOGRAM REPORT   Patient Name:   Karen Randall Gut Date of Exam: 11/02/2020 Medical Rec #:  11/04/2020          Height:       61.0 in Accession #:    [id number]         Weight:       188.4 lb Date of Birth:  1991-11-13  BSA:          1.841 m Patient Age:    29 years           BP:           120/63 mmHg Patient Gender: F                  HR:           63 bpm. Exam Location:  Inpatient Procedure: 2D Echo Indications:    786.50 chest pain  History:        Patient has no prior history of Echocardiogram examinations.                 Recent C-section delivery.  Sonographer:    Celene Skeen RDCS (AE) Referring Phys: [id number] Beatriz Stallion IMPRESSIONS  1. Left ventricular ejection fraction, by estimation, is 60 to 65%. The left ventricle has normal function. The left ventricle has no regional wall motion abnormalities. Left ventricular diastolic parameters were normal.  2. Right ventricular systolic function is normal. The right ventricular size is normal. Tricuspid regurgitation signal is inadequate for assessing PA pressure.  3. The mitral valve is normal in structure. Trivial mitral valve regurgitation. No evidence of mitral stenosis.  4. The aortic valve is tricuspid. Aortic  valve regurgitation is not visualized. No aortic stenosis is present. Normal coronary artery origins.  5. The inferior vena cava is normal in size with <50% respiratory variability, suggesting right atrial pressure of 8 mmHg. Conclusion(s)/Recommendation(s): Normal biventricular function without evidence of hemodynamically significant valvular heart disease. FINDINGS  Left Ventricle: Left ventricular ejection fraction, by estimation, is 60 to 65%. The left ventricle has normal function. The left ventricle has no regional wall motion abnormalities. The left ventricular internal cavity size was normal in size. There is  no left ventricular hypertrophy. Left ventricular diastolic parameters were normal. Right Ventricle: The right ventricular size is normal. No increase in right ventricular wall thickness. Right ventricular systolic function is normal. Tricuspid regurgitation signal is inadequate for assessing PA pressure. Left Atrium: LA volumes may be overestimated. Left atrial size was normal in size. Right Atrium: Right atrial size was normal in size. Pericardium: There is no evidence of pericardial effusion. Mitral Valve: The mitral valve is normal in structure. Trivial mitral valve regurgitation. No evidence of mitral valve stenosis. Tricuspid Valve: The tricuspid valve is normal in structure. Tricuspid valve regurgitation is trivial. No evidence of tricuspid stenosis. Aortic Valve: The aortic valve is tricuspid. Aortic valve regurgitation is not visualized. No aortic stenosis is present. Normal coronary artery origins. Pulmonic Valve: The pulmonic valve was normal in structure. Pulmonic valve regurgitation is trivial. No evidence of pulmonic stenosis. Aorta: The aortic root is normal in size and structure. Venous: The inferior vena cava is normal in size with less than 50% respiratory variability, suggesting right atrial pressure of 8 mmHg. IAS/Shunts: No atrial level shunt detected by color flow Doppler.  LEFT  VENTRICLE PLAX 2D LVIDd:         4.60 cm  Diastology LVIDs:         3.10 cm  LV e' medial:    0.14 cm/s LV PW:         0.80 cm  LV E/e' medial:  7.7 LV IVS:        0.70 cm  LV e' lateral:   0.17 cm/s LVOT diam:     1.85 cm  LV E/e' lateral: 6.1 LV SV:  72 LV SV Index:   39 LVOT Area:     2.69 cm  RIGHT VENTRICLE RV S prime:     18.70 cm/s TAPSE (M-mode): 3.2 cm LEFT ATRIUM             Index       RIGHT ATRIUM           Index LA diam:        3.80 cm 2.06 cm/m  RA Area:     18.50 cm LA Vol (A2C):   75.7 ml 41.11 ml/m RA Volume:   49.80 ml  27.04 ml/m LA Vol (A4C):   59.5 ml 32.31 ml/m LA Biplane Vol: 70.6 ml 38.34 ml/m  AORTIC VALVE LVOT Vmax:   108.00 cm/s LVOT Vmean:  79.900 cm/s LVOT VTI:    0.266 m  AORTA Ao Root diam: 2.60 cm MITRAL VALVE MV Area (PHT): 3.72 cm    SHUNTS MV Decel Time: 204 msec    Systemic VTI:  0.27 m MV E velocity: 1.07 cm/s   Systemic Diam: 1.85 cm MV A velocity: 71.60 cm/s MV E/A ratio:  0.01 Weston Brass MD Electronically signed by Weston Brass MD Signature Date/Time: 11/02/2020/4:49:20 PM    Final     Cardiac Studies   Echo shows normal function.  Patient Profile     29 y.o. female with post partum preeclampsia with elevated BP, dyspnea, and chest pain. Also significant anemia with Hgb 7.5.  Assessment & Plan    1. Postpartum Preeclampsia with improved BP, chest discomfort, and spontaneous diuresis. No evidence cardiomyopathy or ischemia. No further recommendations and BP target < 120/80 mmHg 2. Anemia with hemoglobin 7.6 also contributing.   [facility] will sign off.   Medication Recommendations:  None Other recommendations (labs, testing, etc):  None other than resolution of anemia based on cause. Follow up as an outpatient:  Not neeeded unless BP remains high or recurrent symptoms.  For questions or updates, please contact [facility] Please consult [email or web address] for contact info under        Signed, Lesleigh Noe, MD    11/03/2020, 7:36 AM

## 2020-11-03 NOTE — Telephone Encounter (Signed)
Pt left VM message stating that she receives a prescription from Dr. Vergie Living. She wants to update her pharmacy because there are issues @ her current pharmacy. She requests a call back.

## 2020-11-03 NOTE — Progress Notes (Signed)
Discharge instructions and prescriptions given to pt. Discussed signs and symptoms to report to the MD. Pt verbalizes understanding and has no questions or concerns at this time.

## 2020-11-03 NOTE — Discharge Instructions (Signed)
Hypertension During Pregnancy Hypertension is also called high blood pressure. High blood pressure means that the force of your blood moving in your body is too strong. It can cause problems for you and your baby. Different types of high blood pressure can happen during pregnancy. The types are:  High blood pressure before you got pregnant. This is called chronic hypertension.  This can continue during your pregnancy. Your doctor will want to keep checking your blood pressure. You may need medicine to keep your blood pressure under control while you are pregnant. You will need follow-up visits after you have your baby.  High blood pressure that goes up during pregnancy when it was normal before. This is called gestational hypertension. It will usually get better after you have your baby, but your doctor will need to watch your blood pressure to make sure that it is getting better.  Very high blood pressure during pregnancy. This is called preeclampsia. Very high blood pressure is an emergency that needs to be checked and treated right away.  You may develop very high blood pressure after giving birth. This is called postpartum preeclampsia. This usually occurs within 48 hours after childbirth but may occur up to 6 weeks after giving birth. This is rare. How does this affect me? If you have high blood pressure during pregnancy, you have a higher chance of developing high blood pressure:  As you get older.  If you get pregnant again. In some cases, high blood pressure during pregnancy can cause:  Stroke.  Heart attack.  Damage to the kidneys, lungs, or liver.  Preeclampsia.  Jerky movements you cannot control (convulsions or seizures).  Problems with the placenta.   What can I do to lower my risk?   Keep a healthy weight.  Eat a healthy diet.  Follow what your doctor tells you about treating any medical problems that you had before becoming pregnant. It is very important to go to  all of your doctor visits. Your doctor will check your blood pressure and make sure that your pregnancy is progressing as it should. Treatment should start early if a problem is found.   Follow these instructions at home:  Take your blood pressure 1-2 times per day. Call the office if your blood pressure is 155 or higher for the top number or 105 or higher for the bottom number.    Eating and drinking   Drink enough fluid to keep your pee (urine) pale yellow.  Avoid caffeine. Lifestyle  Do not use any products that contain nicotine or tobacco, such as cigarettes, e-cigarettes, and chewing tobacco. If you need help quitting, ask your doctor.  Do not use alcohol or drugs.  Avoid stress.  Rest and get plenty of sleep.  Regular exercise can help. Ask your doctor what kinds of exercise are best for you. General instructions  Take over-the-counter and prescription medicines only as told by your doctor.  Keep all prenatal and follow-up visits as told by your doctor. This is important. Contact a doctor if:  You have symptoms that your doctor told you to watch for, such as: ? Headaches. ? Nausea. ? Vomiting. ? Belly (abdominal) pain. ? Dizziness. ? Light-headedness. Get help right away if:  You have: ? Very bad belly pain that does not get better with treatment. ? A very bad headache that does not get better. ? Vomiting that does not get better. ? Sudden, fast weight gain. ? Sudden swelling in your hands, ankles, or face. ?   Blood in your pee. ? Blurry vision. ? Double vision. ? Shortness of breath. ? Chest pain. ? Weakness on one side of your body. ? Trouble talking. Summary  High blood pressure is also called hypertension.  High blood pressure means that the force of your blood moving in your body is too strong.  High blood pressure can cause problems for you and your baby.  Keep all follow-up visits as told by your doctor. This is important. This information is  not intended to replace advice given to you by your health care provider. Make sure you discuss any questions you have with your health care provider. Document Released: 01/06/2011 Document Revised: 03/27/2019 Document Reviewed: 12/31/2018 [employer] Patient Education  2020 [employer]   

## 2020-11-04 NOTE — Telephone Encounter (Signed)
Returned patients call. She did not answer. LM that she can call the new pharmacy and ask to have her prescription transferred. Asked her to call back with new pharmacy and or give at next appt so can be updated in chart.

## 2021-04-21 IMAGING — CR DG CHEST 2V
2 series · 2 of 2 positions shown · non-contrast
Comparison: None.

CLINICAL DATA: Postpartum chest pain

EXAM:
CHEST - 2 VIEW

[chest pa]
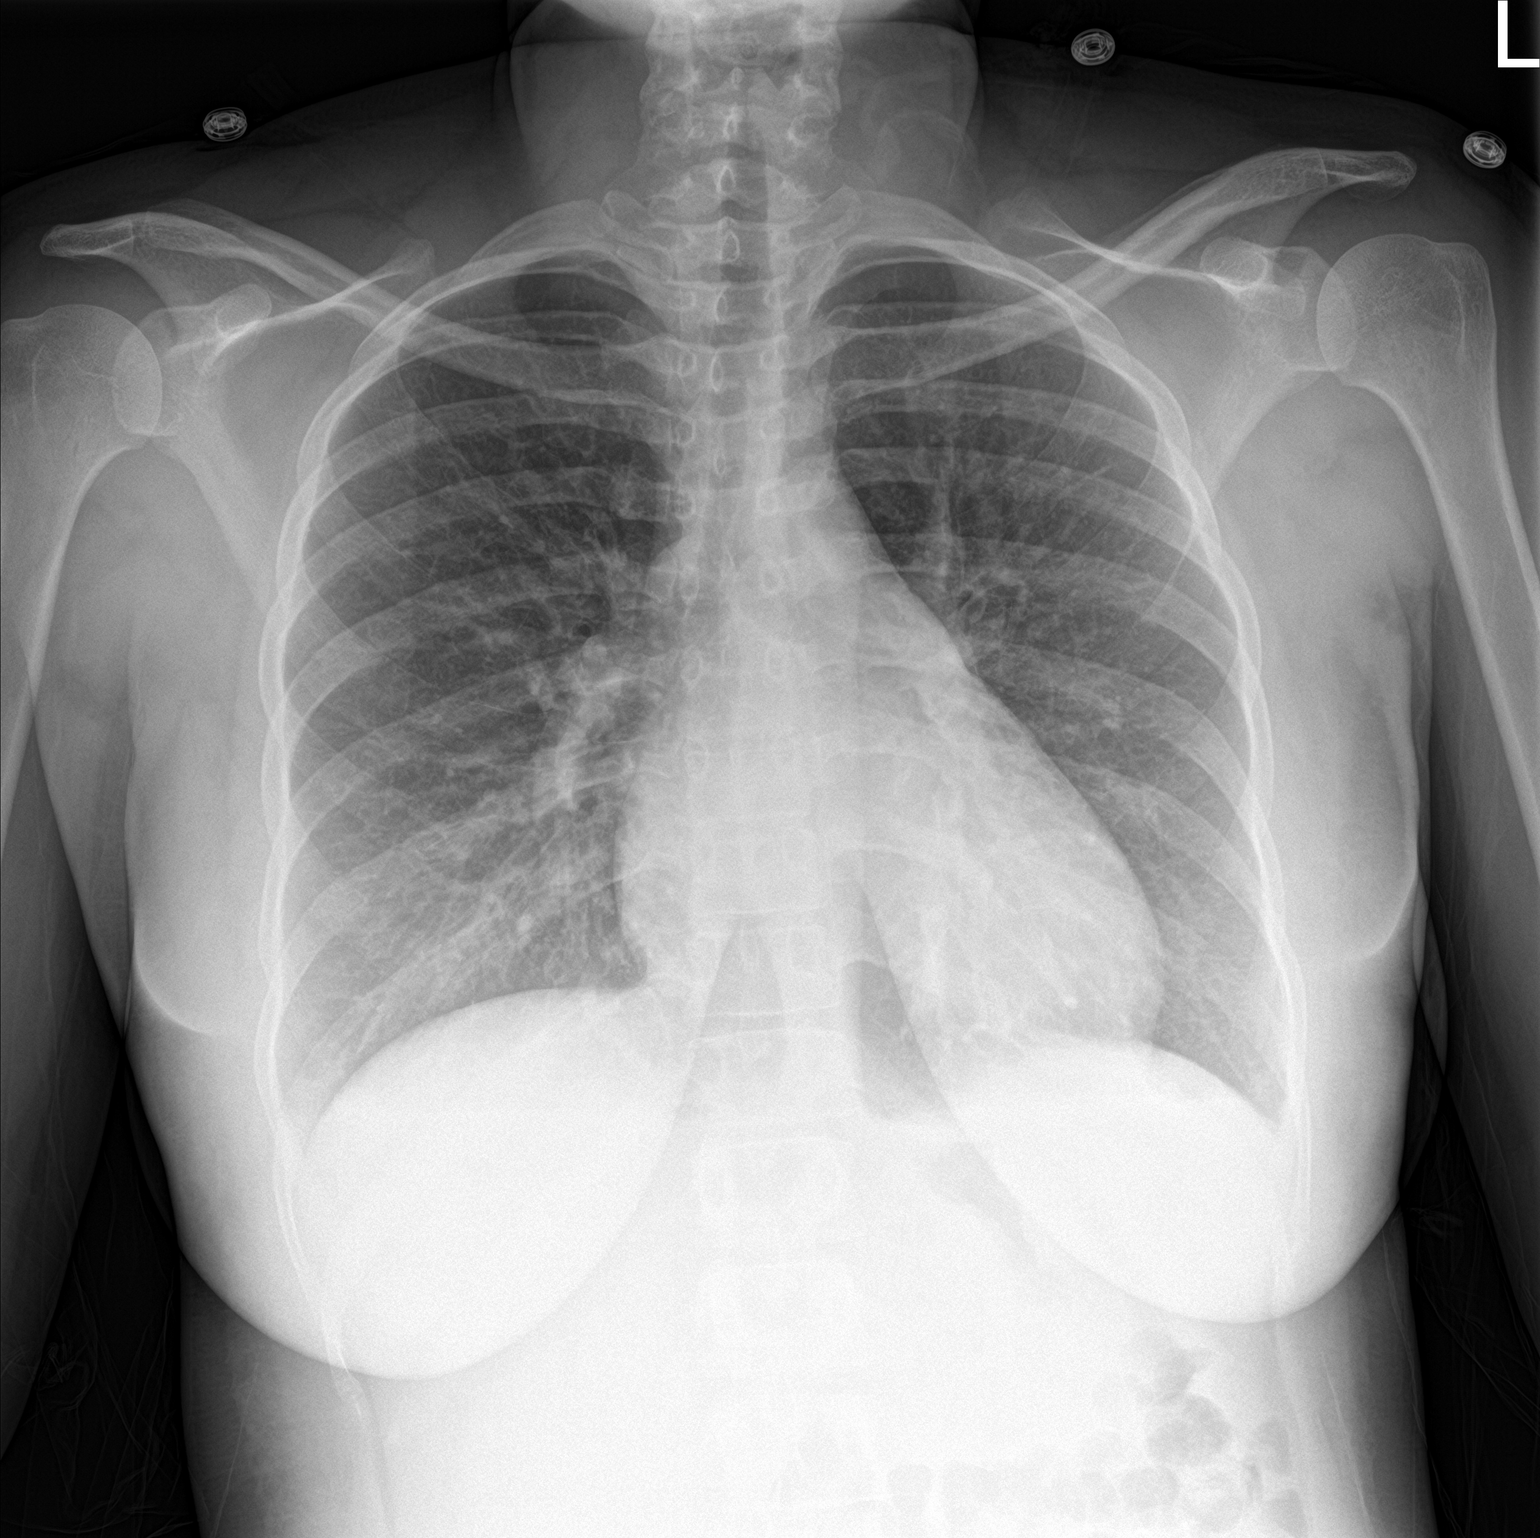

[chest lat]
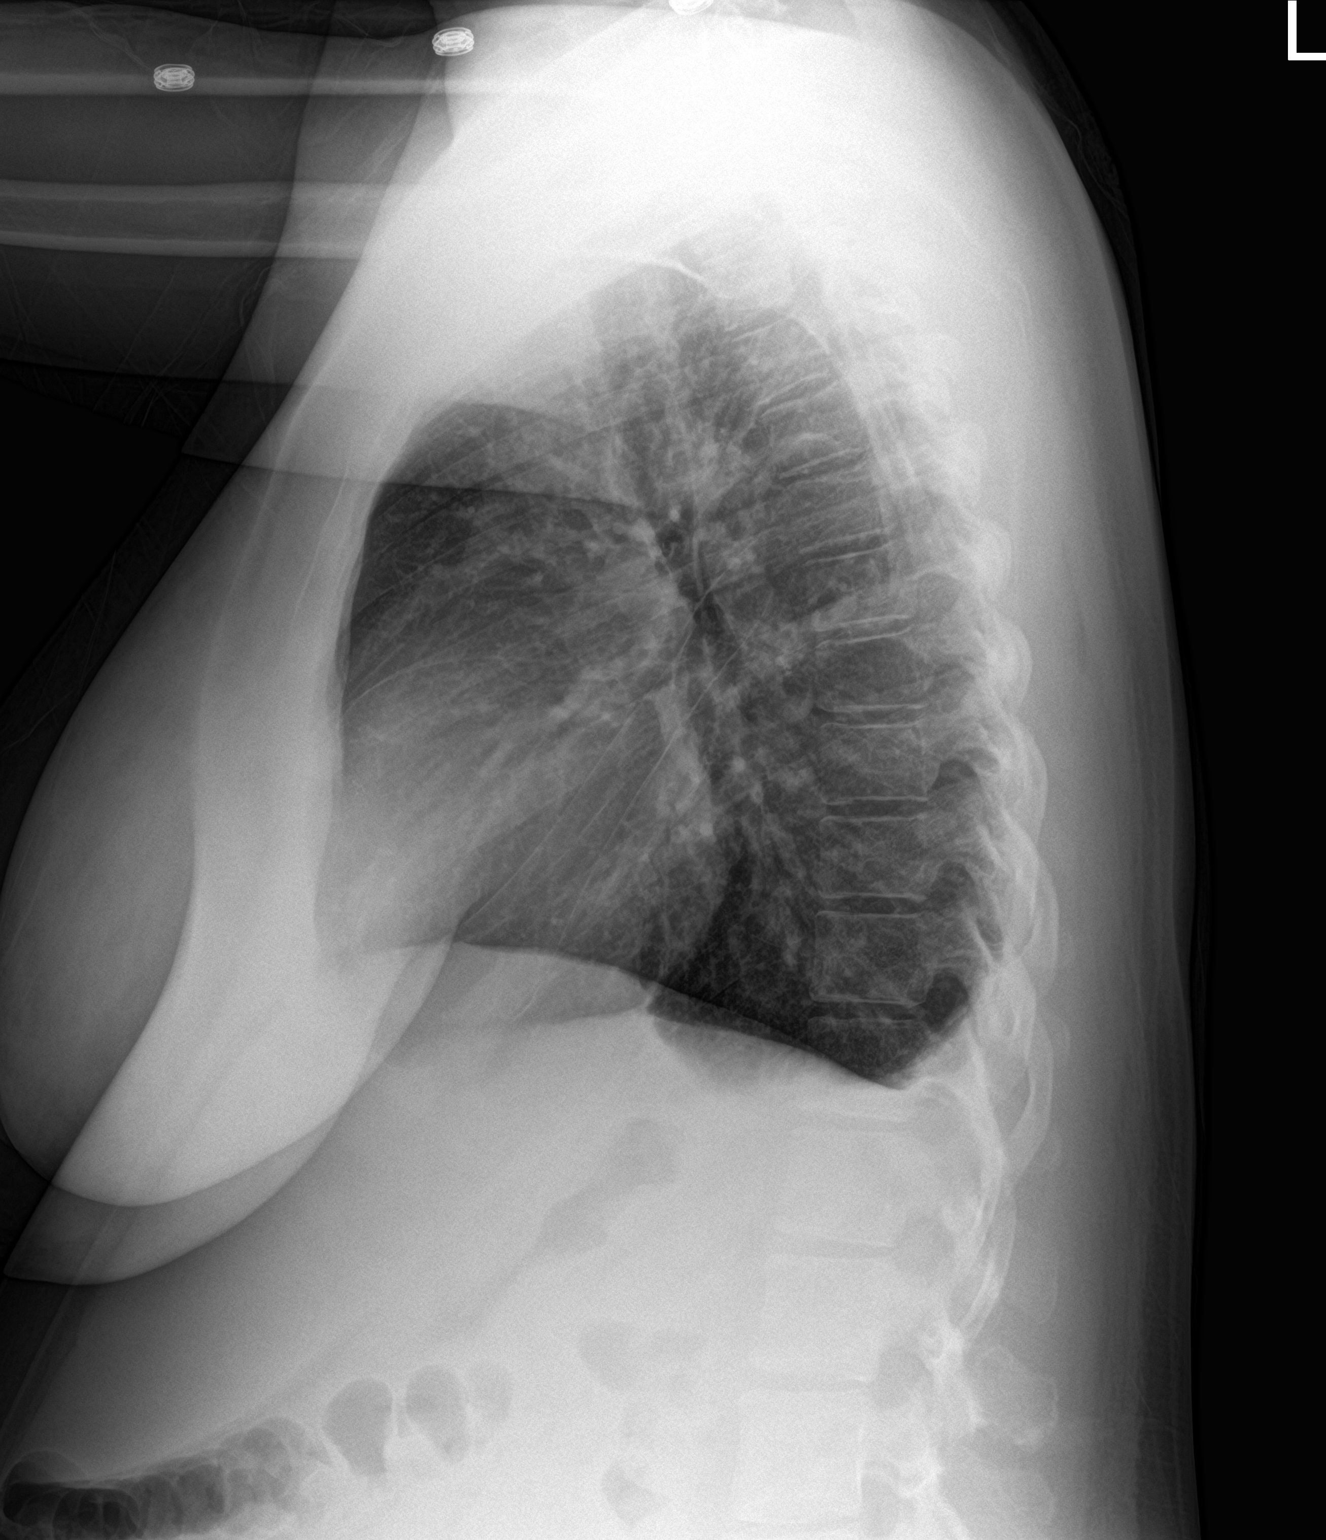

[2 of 2 positions shown; findings below may reference images not displayed]

FINDINGS: The lungs are symmetrically well expanded. No pneumothorax. Tiny
bilateral pleural effusions are present. Cardiac size within normal
limits. Pulmonary vascularity is normal. No acute bone abnormality.
IMPRESSION: Tiny bilateral pleural effusions.

## 2022-02-03 ENCOUNTER — Other Ambulatory Visit: Payer: Self-pay

## 2022-02-03 ENCOUNTER — Ambulatory Visit
Admission: RE | Admit: 2022-02-03 | Discharge: 2022-02-03 | Disposition: A | Discharge status: Home / Self Care | Payer: Medicaid Other | Source: Ambulatory Visit | Attending: Emergency Medicine | Admitting: Emergency Medicine

## 2022-02-03 VITALS — BP 117/86 | HR 92 | Temp 98.6°F | Resp 18

## 2022-02-03 DIAGNOSIS — H1031 Unspecified acute conjunctivitis, right eye: Secondary | ICD-10-CM | POA: Diagnosis present

## 2022-02-03 DIAGNOSIS — J029 Acute pharyngitis, unspecified: Secondary | ICD-10-CM | POA: Diagnosis present

## 2022-02-03 DIAGNOSIS — J358 Other chronic diseases of tonsils and adenoids: Secondary | ICD-10-CM | POA: Diagnosis present

## 2022-02-03 DIAGNOSIS — J351 Hypertrophy of tonsils: Secondary | ICD-10-CM | POA: Insufficient documentation

## 2022-02-03 DIAGNOSIS — J310 Chronic rhinitis: Secondary | ICD-10-CM | POA: Insufficient documentation

## 2022-02-03 DIAGNOSIS — J329 Chronic sinusitis, unspecified: Secondary | ICD-10-CM | POA: Insufficient documentation

## 2022-02-03 LAB — POCT RAPID STREP A (OFFICE): Rapid Strep A Screen: NEGATIVE

## 2022-02-03 LAB — POCT MONO SCREEN (KUC): Mono, POC: NEGATIVE

## 2022-02-03 MED ORDER — IPRATROPIUM BROMIDE 0.06 % NA SOLN: 2.0000 | Freq: Four times a day (QID) | NASAL | 0 refills | Status: DC

## 2022-02-03 MED ORDER — CEFDINIR 300 MG PO CAPS: 300.0000 mg | ORAL_CAPSULE | Freq: Two times a day (BID) | ORAL | 0 refills | Status: AC

## 2022-02-03 MED ORDER — LIDOCAINE VISCOUS HCL 2 % MT SOLN: 15.0000 mL | OROMUCOSAL | 0 refills | Status: DC | PRN

## 2022-02-03 MED ORDER — OLOPATADINE HCL 0.1 % OP SOLN: 1.0000 [drp] | Freq: Two times a day (BID) | OPHTHALMIC | 0 refills | Status: DC

## 2022-02-03 MED ORDER — IBUPROFEN 400 MG PO TABS: 400.0000 mg | ORAL_TABLET | Freq: Three times a day (TID) | ORAL | 0 refills | Status: DC | PRN

## 2022-02-03 MED ORDER — IBUPROFEN 800 MG PO TABS
800.0000 mg | ORAL_TABLET | Freq: Once | ORAL | Status: AC
  Administered 2022-02-03: 800 mg via ORAL

## 2022-02-03 NOTE — Discharge Instructions (Addendum)
Based on your history and my physical exam findings, I am unable to conclusively tell you exactly what is going on today.  I do have some ideas about how to get you feeling better.  You were tested for both COVID and influenza today because here in the urgent care setting, we do not have an available option for an individual influenza test.  I apologize for the redundancy if you have already taken a COVID test at home.  The result of your viral testing will be posted to your MyChart once it is complete, this typically takes 24 to 48 hours.  If there is a positive result, you will be contacted by phone with further recommendations, if any.    Mononucleosis is a virus that is commonly passed around young people, we are all exposed to it at some point in our early lives and developing immunity to it.  Lately, we have been seeing more adult patients showing up positive for mono, it is believed that there is a more virulent strain of the virus going around at this time.  Your mononucleosis test today was negative.    Your strep test today is negative.  Throat culture will be performed per our protocol.  The result of your throat culture will be posted to your MyChart once it is complete, this typically takes 3 to 5 days.  If there is a positive result, you will be contacted by phone and antibiotics will be prescribed for you.  Because you have significant swelling of both tonsils, significant redness of both tonsils and white patches on both tonsils, I have prescribed Cefdinir 300 mg twice daily for 10 days to treat you for bacterial pharyngitis.  Cefdinir covers group A strep along with other known causative organisms.  Please take this antibiotic as prescribed and do not skip any doses.  Once you have been on antibiotics for a full 24 hours, you are no longer considered contagious.       If you receive a phone call telling you that your throat culture is negative, I still strongly recommend that you complete  your entire prescription of antibiotics regardless of the result of your throat culture, particularly if you begin to feel better within the first 48 hours of therapy.  The throat culture we perform here at urgent care only tests for group A strep and not any of the other bacteria that can also cause bacterial pharyngitis.  Please follow-up with your primary care provider in the next week to ten days for repeat evaluation if you have not had complete resolution of your symptoms.     Please see the list below for recommended medications, dosages and frequencies to provide relief of your current symptoms:     Cefdinir (Omnicef):  1 capsule twice daily for 10 days, you can take it with or without food.  This antibiotic can cause upset stomach, this will resolve once antibiotics are complete.  You are welcome to use a probiotic, eat yogurt, take Imodium while taking this medication.  Please avoid other systemic medications such as Maalox, Pepto-Bismol or milk of magnesia as they can interfere with your body's ability to absorb the antibiotics.  According to the latest research, cefdinir was not detectable and breastmilk following a single dose of 600 mg, no studies have been performed on breast-feeding women for 10-day courses but since you are only taking 300 mg every 12 hours, I am comfortable that very little, if any, is going to be  passed along to your son through your breastmilk.   Ibuprofen  (Advil, Motrin): This is a good anti-inflammatory medication which addresses aches, pains and inflammation of the upper airways that causes sinus and nasal congestion as well as in the lower airways which makes your cough feel tight and sometimes burn.  I recommend that you take between 400 to 600 mg every 6-8 hours as needed, I have provided you with a prescription for 400 mg.      Acetaminophen (Tylenol): This is a good fever reducer.  If your body temperature rises above 101.5 as measured with a thermometer, it is  recommended that you take 1,000 mg every 8 hours until your temperature falls below 101.5, please not take more than 3,000 mg of acetaminophen either as a separate medication or as in ingredient in an over-the-counter cold/flu preparation within a 24-hour period.      Ipratropium (Atrovent): This is an excellent nasal decongestant spray that does not cause rebound congestion, can be used up to 4 times daily as needed, instill 2 sprays into each nare with each use.  I have provided you with a prescription for this medication.      Lidocaine (Xylocaine): This is a numbing medication that can be swished for 15 seconds and swallowed.  You can use this every 3 hours while awake to relieve pain in your mouth and throat.  I have sent a prescription for this medication to your pharmacy.   Conservative care is also recommended at this time.  This includes rest, pushing clear fluids and activity as tolerated.  Warm beverages such as teas and broths versus cold beverages/popsicles and frozen sherbet/sorbet are your choice, both warm and cold are beneficial.  You may also notice that your appetite is reduced; this is okay as long as you are drinking plenty of clear fluids.    Please follow-up within the next 3 to 5 days either with your primary care provider or urgent care if your symptoms do not resolve.  If you do not have a primary care provider, we will assist you in finding one.

## 2022-02-03 NOTE — ED Triage Notes (Signed)
Pt c/o sore throat and pink eye (with drainage and itching) since Tuesday.   Pt states she recently tested positive for Covid last week.

## 2022-02-03 NOTE — ED Provider Notes (Signed)
[facility]    CSN: [id number] Arrival date & time: 02/03/22  0847    HISTORY   Chief Complaint  Patient presents with   Sore Throat   Conjunctivitis   HPI Karen Randall is a 31 y.o. female. Patient complains of sore throat and redness to both eyes for the past 3 days.  Patient is here with her 2 small children who she reports are also sick.  Patient states her tonsils are very big and they have white patches all over them.  Reports difficulty swallowing and hoarseness of voice.  Patient denies nausea, vomiting, diarrhea.  Patient endorses body ache and fatigue.  Patient states the redness in her eyes began about 3 days ago, has not had any purulent drainage from eyes but states they are producing lots of tears.  Denies eye pain, vision changes.  The history is provided by the patient.  Past Medical History:  Diagnosis Date   Anemia    Patient Active Problem List   Diagnosis Date Noted   Hypertension in pregnancy, preeclampsia, severe, delivered/postpartum 11/02/2020   VAGINAL DISCHARGE 04/29/2009   Past Surgical History:  Procedure Laterality Date   CESAREAN SECTION     OB History     Gravida  2   Para  2   Term  2   Preterm      AB      Living  2      SAB      IAB      Ectopic      Multiple      Live Births  2          Home Medications    Prior to Admission medications   Medication Sig Start Date End Date Taking? Authorizing Provider  acetaminophen (TYLENOL) 500 MG tablet Take 500 mg by mouth every 6 (six) hours as needed for mild pain.    [provider]  docusate sodium (COLACE) 100 MG capsule Take 1 capsule (100 mg total) by mouth 2 (two) times daily as needed for mild constipation or moderate constipation. 11/03/20   Aletha Halim, MD  ferrous sulfate (FERROUSUL) 325 (65 FE) MG tablet Take 1 tablet (325 mg total) by mouth daily with breakfast. 11/03/20 01/02/21  Aletha Halim, MD  ibuprofen (ADVIL) 200 MG tablet  Take 200 mg by mouth every 6 (six) hours as needed.    [provider]  oxyCODONE-acetaminophen (PERCOCET/ROXICET) 5-325 MG tablet Take 1-2 tablets by mouth every 6 (six) hours as needed. 11/03/20   Aletha Halim, MD   Family History History reviewed. No pertinent family history. Social History Social History   Tobacco Use   Smoking status: Never   Smokeless tobacco: Never  Substance Use Topics   Alcohol use: Yes    Comment: socially   Drug use: No   Allergies   Latex  Review of Systems Review of Systems Pertinent findings noted in history of present illness.   Physical Exam Triage Vital Signs ED Triage Vitals  Enc Vitals Group     BP 10/14/21 0827 (!) 147/82     Pulse Rate 10/14/21 0827 72     Resp 10/14/21 0827 18     Temp 10/14/21 0827 98.3 F (36.8 C)     Temp Source 10/14/21 0827 Oral     SpO2 10/14/21 0827 98 %     Weight --      Height --      Head Circumference --  Peak Flow --      Pain Score 10/14/21 0826 5     Pain Loc --      Pain Edu? --      Excl. in [place]? --   No data found.  Updated Vital Signs BP 117/86 (BP Location: Left Arm)    Pulse 92    Temp 98.6 F (37 C) (Oral)    Resp 18    LMP 01/14/2022    SpO2 96%   Physical Exam Constitutional:      General: She is not in acute distress.    Appearance: She is well-developed. She is ill-appearing. She is not toxic-appearing.  HENT:     Head: Normocephalic and atraumatic.     Salivary Glands: Right salivary gland is diffusely enlarged and tender. Left salivary gland is diffusely enlarged and tender.     Right Ear: Hearing and external ear normal.     Left Ear: Hearing and external ear normal.     Ears:     Comments: Bilateral EACs with mild erythema, bilateral TMs are normal    Nose: No mucosal edema, congestion or rhinorrhea.     Right Turbinates: Not enlarged, swollen or pale.     Left Turbinates: Not enlarged or swollen.     Right Sinus: No maxillary sinus tenderness or frontal  sinus tenderness.     Left Sinus: No maxillary sinus tenderness or frontal sinus tenderness.     Mouth/Throat:     Lips: Pink. No lesions.     Mouth: Mucous membranes are moist. No oral lesions or angioedema.     Dentition: No gingival swelling.     Tongue: No lesions.     Palate: No mass.     Pharynx: Uvula midline. Pharyngeal swelling, oropharyngeal exudate and posterior oropharyngeal erythema present. No uvula swelling.     Tonsils: Tonsillar exudate present. 2+ on the right. 2+ on the left.  Eyes:     General: Lids are normal. Lids are everted, no foreign bodies appreciated.        Right eye: No foreign body, discharge or hordeolum.        Left eye: No foreign body, discharge or hordeolum.     Extraocular Movements: Extraocular movements intact.     Conjunctiva/sclera:     Right eye: Right conjunctiva is injected.     Left eye: Left conjunctiva is injected.     Pupils: Pupils are equal, round, and reactive to light.  Neck:     Thyroid: No thyroid mass, thyromegaly or thyroid tenderness.     Trachea: Tracheal tenderness present. No abnormal tracheal secretions or tracheal deviation.     Comments: Voice is muffled Cardiovascular:     Rate and Rhythm: Normal rate and regular rhythm.     Pulses: Normal pulses.     Heart sounds: Normal heart sounds, S1 normal and S2 normal. No murmur heard.   No friction rub. No gallop.  Pulmonary:     Effort: Pulmonary effort is normal. No accessory muscle usage, prolonged expiration, respiratory distress or retractions.     Breath sounds: No stridor, decreased air movement or transmitted upper airway sounds. No decreased breath sounds, wheezing, rhonchi or rales.  Abdominal:     General: Bowel sounds are normal.     Palpations: Abdomen is soft.     Tenderness: There is generalized abdominal tenderness. There is no right CVA tenderness, left CVA tenderness or rebound. Negative signs include Murphy's sign.     Hernia: No  hernia is present.   Musculoskeletal:        General: No tenderness. Normal range of motion.     Cervical back: Full passive range of motion without pain, normal range of motion and neck supple.     Right lower leg: No edema.     Left lower leg: No edema.  Lymphadenopathy:     Cervical: Cervical adenopathy present.     Right cervical: Superficial cervical adenopathy present.     Left cervical: Superficial cervical adenopathy present.  Skin:    General: Skin is warm and dry.     Findings: No erythema, lesion or rash.  Neurological:     General: No focal deficit present.     Mental Status: She is alert and oriented to person, place, and time. Mental status is at baseline.  Psychiatric:        Mood and Affect: Mood normal.        Behavior: Behavior normal.        Thought Content: Thought content normal.        Judgment: Judgment normal.    Visual Acuity Right Eye Distance:   Left Eye Distance:   Bilateral Distance:    Right Eye Near:   Left Eye Near:    Bilateral Near:     UC Couse / Diagnostics / Procedures:    EKG  Radiology No results found.  Procedures Procedures (including critical care time)  UC Diagnoses / Final Clinical Impressions(s)   I have reviewed the triage vital signs and the nursing notes.  Pertinent labs & imaging results that were available during my care of the patient were reviewed by me and considered in my medical decision making (see chart for details).   Final diagnoses:  Enlarged tonsils  Tonsillar exudate  Acute pharyngitis, unspecified etiology  Acute conjunctivitis of right eye, unspecified acute conjunctivitis type  Rhinosinusitis   Rapid strep test today is negative, Monospot test is negative, COVID and influenza tests are still pending.  Based on physical exam findings, recommend patient begin antibiotics empirically for bacterial tonsillitis/pharyngitis.  Patient also provided with ibuprofen for pain relief, ipratropium to dry up mucous membranes and  lidocaine for comfort.  Return precautions advised.  We will notify patient of viral testing once complete, patient is outside window for influenza treatment but may benefit from Paxlovid if COVID positive.  Of note, patient states she did not take a COVID test last week. ED Prescriptions     Medication Sig Dispense Auth. Provider   cefdinir (OMNICEF) 300 MG capsule Take 1 capsule (300 mg total) by mouth 2 (two) times daily for 10 days. 20 capsule Lynden Oxford Scales, PA-C   olopatadine (PATADAY) 0.1 % ophthalmic solution Place 1 drop into both eyes 2 (two) times daily. 5 mL Lynden Oxford Scales, PA-C   ibuprofen (ADVIL) 400 MG tablet Take 1 tablet (400 mg total) by mouth every 8 (eight) hours as needed for up to 30 doses. 30 tablet Lynden Oxford Scales, PA-C   ipratropium (ATROVENT) 0.06 % nasal spray Place 2 sprays into both nostrils 4 (four) times daily. As needed for nasal congestion, runny nose 15 mL Lynden Oxford Scales, PA-C   lidocaine (XYLOCAINE) 2 % solution Use as directed 15 mLs in the mouth or throat every 3 (three) hours as needed for mouth pain (Sore throat). 300 mL Lynden Oxford Scales, PA-C      PDMP not reviewed this encounter.  Pending results:  Labs Reviewed  CULTURE, GROUP A STREP (  [place])  COVID-19, FLU A+B NAA  POCT RAPID STREP A (OFFICE)  POCT MONO SCREEN ([facility])    Medications Ordered in UC: Medications  ibuprofen (ADVIL) tablet 800 mg (800 mg Oral Given 02/03/22 1058)    Disposition Upon Discharge:  Condition: stable for discharge home Home: take medications as prescribed; routine discharge instructions as discussed; follow up as advised.  Patient presented with an acute illness with associated systemic symptoms and significant discomfort requiring urgent management. In my opinion, this is a condition that a prudent lay person (someone who possesses an average knowledge of health and medicine) may potentially expect to result in complications if not  addressed urgently such as respiratory distress, impairment of bodily function or dysfunction of bodily organs.   Routine symptom specific, illness specific and/or disease specific instructions were discussed with the patient and/or caregiver at length.   As such, the patient has been evaluated and assessed, work-up was performed and treatment was provided in alignment with urgent care protocols and evidence based medicine.  Patient/parent/caregiver has been advised that the patient may require follow up for further testing and treatment if the symptoms continue in spite of treatment, as clinically indicated and appropriate.  If the patient was tested for COVID-19, Influenza and/or RSV, then the patient/parent/guardian was advised to isolate at home pending the results of his/her diagnostic coronavirus test and potentially longer if theyre positive. I have also advised pt that if his/her COVID-19 test returns positive, it's recommended to self-isolate for at least 10 days after symptoms first appeared AND until fever-free for 24 hours without fever reducer AND other symptoms have improved or resolved. Discussed self-isolation recommendations as well as instructions for household member/close contacts as per the [facility] [place] DH[facility], and also gave patient the [place] packet with this information.  Patient/parent/caregiver has been advised to return to the [facility] or PCP in 3-5 days if no better; to PCP or the Emergency Department if new signs and symptoms develop, or if the current signs or symptoms continue to change or worsen for further workup, evaluation and treatment as clinically indicated and appropriate  The patient will follow up with their current PCP if and as advised. If the patient does not currently have a PCP we will assist them in obtaining one.   The patient may need specialty follow up if the symptoms continue, in spite of conservative treatment and management, for further workup, evaluation,  consultation and treatment as clinically indicated and appropriate.  Patient/parent/caregiver verbalized understanding and agreement of plan as discussed.  All questions were addressed during visit.  Please see discharge instructions below for further details of plan.  Discharge Instructions:   Discharge Instructions      Based on your history and my physical exam findings, I am unable to conclusively tell you exactly what is going on today.  I do have some ideas about how to get you feeling better.  You were tested for both COVID and influenza today because here in the urgent care setting, we do not have an available option for an individual influenza test.  I apologize for the redundancy if you have already taken a COVID test at home.  The result of your viral testing will be posted to your MyChart once it is complete, this typically takes 24 to 48 hours.  If there is a positive result, you will be contacted by phone with further recommendations, if any.    Mononucleosis is a virus that is commonly passed around young people,  we are all exposed to it at some point in our early lives and developing immunity to it.  Lately, we have been seeing more adult patients showing up positive for mono, it is believed that there is a more virulent strain of the virus going around at this time.  Your mononucleosis test today was negative.    Your strep test today is negative.  Throat culture will be performed per our protocol.  The result of your throat culture will be posted to your MyChart once it is complete, this typically takes 3 to 5 days.  If there is a positive result, you will be contacted by phone and antibiotics will be prescribed for you.  Because you have significant swelling of both tonsils, significant redness of both tonsils and white patches on both tonsils, I have prescribed Cefdinir 300 mg twice daily for 10 days to treat you for bacterial pharyngitis.  Cefdinir covers group A strep along  with other known causative organisms.  Please take this antibiotic as prescribed and do not skip any doses.  Once you have been on antibiotics for a full 24 hours, you are no longer considered contagious.       If you receive a phone call telling you that your throat culture is negative, I still strongly recommend that you complete your entire prescription of antibiotics regardless of the result of your throat culture, particularly if you begin to feel better within the first 48 hours of therapy.  The throat culture we perform here at urgent care only tests for group A strep and not any of the other bacteria that can also cause bacterial pharyngitis.  Please follow-up with your primary care provider in the next week to ten days for repeat evaluation if you have not had complete resolution of your symptoms.     Please see the list below for recommended medications, dosages and frequencies to provide relief of your current symptoms:     Cefdinir (Omnicef):  1 capsule twice daily for 10 days, you can take it with or without food.  This antibiotic can cause upset stomach, this will resolve once antibiotics are complete.  You are welcome to use a probiotic, eat yogurt, take Imodium while taking this medication.  Please avoid other systemic medications such as Maalox, Pepto-Bismol or milk of magnesia as they can interfere with your body's ability to absorb the antibiotics.  According to the latest research, cefdinir was not detectable and breastmilk following a single dose of 600 mg, no studies have been performed on breast-feeding women for 10-day courses but since you are only taking 300 mg every 12 hours, I am comfortable that very little, if any, is going to be passed along to your son through your breastmilk.   Ibuprofen  (Advil, Motrin): This is a good anti-inflammatory medication which addresses aches, pains and inflammation of the upper airways that causes sinus and nasal congestion as well as in the lower  airways which makes your cough feel tight and sometimes burn.  I recommend that you take between 400 to 600 mg every 6-8 hours as needed, I have provided you with a prescription for 400 mg.      Acetaminophen (Tylenol): This is a good fever reducer.  If your body temperature rises above 101.5 as measured with a thermometer, it is recommended that you take 1,000 mg every 8 hours until your temperature falls below 101.5, please not take more than 3,000 mg of acetaminophen either as a separate medication  or as in ingredient in an over-the-counter cold/flu preparation within a 24-hour period.      Ipratropium (Atrovent): This is an excellent nasal decongestant spray that does not cause rebound congestion, can be used up to 4 times daily as needed, instill 2 sprays into each nare with each use.  I have provided you with a prescription for this medication.      Lidocaine (Xylocaine): This is a numbing medication that can be swished for 15 seconds and swallowed.  You can use this every 3 hours while awake to relieve pain in your mouth and throat.  I have sent a prescription for this medication to your pharmacy.   Conservative care is also recommended at this time.  This includes rest, pushing clear fluids and activity as tolerated.  Warm beverages such as teas and broths versus cold beverages/popsicles and frozen sherbet/sorbet are your choice, both warm and cold are beneficial.  You may also notice that your appetite is reduced; this is okay as long as you are drinking plenty of clear fluids.    Please follow-up within the next 3 to 5 days either with your primary care provider or urgent care if your symptoms do not resolve.  If you do not have a primary care provider, we will assist you in finding one.       This office note has been dictated using Museum/gallery curator.  Unfortunately, and despite my best efforts, this method of dictation can sometimes lead to occasional typographical or  grammatical errors.  I apologize in advance if this occurs.     Lynden Oxford Scales, PA-C 02/03/22 1442

## 2022-02-06 LAB — CULTURE, GROUP A STREP (THRC)

## 2022-02-08 LAB — COVID-19, FLU A+B NAA
Influenza A, NAA: NOT DETECTED
Influenza B, NAA: NOT DETECTED
SARS-CoV-2, NAA: NOT DETECTED

## 2022-04-05 ENCOUNTER — Other Ambulatory Visit: Payer: Self-pay

## 2022-09-06 ENCOUNTER — Encounter: Payer: Self-pay | Admitting: Emergency Medicine

## 2022-09-06 ENCOUNTER — Ambulatory Visit (INDEPENDENT_AMBULATORY_CARE_PROVIDER_SITE_OTHER): Payer: Medicaid Other

## 2022-09-06 ENCOUNTER — Ambulatory Visit
Admission: EM | Admit: 2022-09-06 | Discharge: 2022-09-06 | Disposition: A | Discharge status: Home / Self Care | Payer: Medicaid Other | Attending: Urgent Care | Admitting: Urgent Care

## 2022-09-06 DIAGNOSIS — M62838 Other muscle spasm: Secondary | ICD-10-CM

## 2022-09-06 DIAGNOSIS — M542 Cervicalgia: Secondary | ICD-10-CM

## 2022-09-06 DIAGNOSIS — M25511 Pain in right shoulder: Secondary | ICD-10-CM

## 2022-09-06 MED ORDER — NAPROXEN 375 MG PO TABS: 375.0000 mg | ORAL_TABLET | Freq: Two times a day (BID) | ORAL | 0 refills | Status: DC
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
## 2023-01-02 ENCOUNTER — Ambulatory Visit (HOSPITAL_COMMUNITY)
Admission: EM | Admit: 2023-01-02 | Discharge: 2023-01-02 | Disposition: A | Discharge status: Home / Self Care | Payer: Medicaid Other | Attending: Family Medicine | Admitting: Family Medicine

## 2023-01-02 ENCOUNTER — Encounter (HOSPITAL_COMMUNITY): Payer: Self-pay

## 2023-01-02 ENCOUNTER — Ambulatory Visit (INDEPENDENT_AMBULATORY_CARE_PROVIDER_SITE_OTHER): Payer: Medicaid Other

## 2023-01-02 DIAGNOSIS — M25512 Pain in left shoulder: Secondary | ICD-10-CM

## 2023-01-02 DIAGNOSIS — M898X1 Other specified disorders of bone, shoulder: Secondary | ICD-10-CM

## 2023-01-02 MED ORDER — IBUPROFEN 600 MG PO TABS: 600.0000 mg | ORAL_TABLET | Freq: Three times a day (TID) | ORAL | 0 refills | Status: AC | PRN

## 2023-01-02 NOTE — Discharge Instructions (Addendum)
Your x-rays did not show any broken bones.   Please follow-up with the primary care appointment you have later this month.  They can help you determine if you need any further evaluation x-rays are negative for any fracture.   Take ibuprofen 600 mg--1 tab every 8 hours as needed for pain.
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
## 2023-01-11 ENCOUNTER — Ambulatory Visit: Payer: Medicaid Other | Admitting: Internal Medicine

## 2023-04-26 ENCOUNTER — Ambulatory Visit: Payer: Medicaid Other | Admitting: Internal Medicine

## 2023-08-30 ENCOUNTER — Ambulatory Visit: Payer: Medicaid Other | Admitting: Internal Medicine

## 2024-01-08 ENCOUNTER — Ambulatory Visit: Payer: Medicaid Other | Admitting: Internal Medicine

## 2024-10-08 ENCOUNTER — Other Ambulatory Visit (HOSPITAL_COMMUNITY): Payer: Self-pay

## 2024-10-08 MED ORDER — FLUZONE 0.5 ML IM SUSY
0.5000 mL | PREFILLED_SYRINGE | Freq: Once | INTRAMUSCULAR | 0 refills | Status: AC
  Filled 2024-10-08: qty 0.5, 1d supply, fill #0
